# Patient Record
Sex: Male | Born: 1950 | Race: Black or African American | Hispanic: No | Marital: Single | Smoking: Former smoker
Health system: Southern US, Community
[De-identification: ages and names within clinical notes are randomized; demographics above are authoritative.]

## PROBLEM LIST (undated history)

## (undated) DIAGNOSIS — E785 Hyperlipidemia, unspecified: Secondary | ICD-10-CM

## (undated) DIAGNOSIS — I2510 Atherosclerotic heart disease of native coronary artery without angina pectoris: Secondary | ICD-10-CM

## (undated) DIAGNOSIS — I10 Essential (primary) hypertension: Secondary | ICD-10-CM

## (undated) DIAGNOSIS — M1990 Unspecified osteoarthritis, unspecified site: Secondary | ICD-10-CM

## (undated) DIAGNOSIS — J309 Allergic rhinitis, unspecified: Secondary | ICD-10-CM

## (undated) DIAGNOSIS — K649 Unspecified hemorrhoids: Secondary | ICD-10-CM

## (undated) DIAGNOSIS — K219 Gastro-esophageal reflux disease without esophagitis: Secondary | ICD-10-CM

## (undated) HISTORY — PX: OTHER SURGICAL HISTORY: SHX169

## (undated) HISTORY — PX: COLONOSCOPY: SHX5424

---

## 1997-08-22 ENCOUNTER — Ambulatory Visit (HOSPITAL_COMMUNITY): Admission: RE | Admit: 1997-08-22 | Discharge: 1997-08-22 | Payer: Self-pay | Admitting: *Deleted

## 1997-09-18 ENCOUNTER — Ambulatory Visit (HOSPITAL_COMMUNITY): Admission: RE | Admit: 1997-09-18 | Discharge: 1997-09-18 | Payer: Self-pay | Admitting: *Deleted

## 1998-01-17 ENCOUNTER — Encounter: Payer: Self-pay | Admitting: Family Medicine

## 1998-01-17 ENCOUNTER — Ambulatory Visit (HOSPITAL_COMMUNITY): Admission: RE | Admit: 1998-01-17 | Discharge: 1998-01-17 | Payer: Self-pay | Admitting: Family Medicine

## 2001-06-20 ENCOUNTER — Encounter: Payer: Self-pay | Admitting: General Surgery

## 2001-06-20 ENCOUNTER — Encounter: Payer: Self-pay | Admitting: Emergency Medicine

## 2001-06-20 ENCOUNTER — Inpatient Hospital Stay (HOSPITAL_COMMUNITY): Admission: EM | Admit: 2001-06-20 | Discharge: 2001-06-28 | Payer: Self-pay | Admitting: Emergency Medicine

## 2001-06-21 ENCOUNTER — Encounter: Payer: Self-pay | Admitting: Neurosurgery

## 2001-06-26 ENCOUNTER — Encounter: Payer: Self-pay | Admitting: General Surgery

## 2002-05-31 ENCOUNTER — Encounter: Payer: Self-pay | Admitting: Geriatric Medicine

## 2002-05-31 ENCOUNTER — Inpatient Hospital Stay (HOSPITAL_COMMUNITY): Admission: AD | Admit: 2002-05-31 | Discharge: 2002-06-02 | Payer: Self-pay | Admitting: Cardiology

## 2009-10-22 ENCOUNTER — Emergency Department (HOSPITAL_COMMUNITY): Admission: EM | Admit: 2009-10-22 | Discharge: 2009-10-22 | Payer: Self-pay | Admitting: Emergency Medicine
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
## 2010-07-17 NOTE — Cardiovascular Report (Signed)
NAME:  Sean Adams, Sean Adams                       ACCOUNT NO.:  [id number]   MEDICAL RECORD NO.:  [id number]                   PATIENT TYPE:  INP   LOCATION:  2005                                 FACILITY:  [facility]   PHYSICIAN:  Arturo Morton. Riley Kill, M.D. [facility]         DATE OF BIRTH:  1950/11/13   DATE OF PROCEDURE:  06/01/2002  DATE OF DISCHARGE:                              CARDIAC CATHETERIZATION   INDICATIONS FOR PROCEDURE:  The patient is a pleasant 60 year old gentleman  referred through the courtesy of Dr. Perrin Maltese.  The patient has had some recent  discomfort that has been worrisome for angina, and had mild lateral EKG  changes.  He was subsequently brought to the office and admitted for  unstable angina.  He was put on for the catheterization schedule after  discussion of the risks, benefits, and alternatives in detail.  Importantly,  we subsequently discovered that the patient had a catheterization in 1999 by  Dr. Meryl Crutch, and at that time, did not have critical disease.  The  patient was unaware of this prior catheterization, but the angiographic  studies match identically on the study from today.   PROCEDURES:  1. Left-heart catheterization.  2. Selective coronary arteriography.  3. Selective left ventriculography.   DESCRIPTION OF PROCEDURE:  The procedure was performed from the right  femoral artery using 6-French catheters.  He tolerated the procedure well.  There were no complications.  I then subsequently reviewed the patient's  films with his wife and his sister.  I also reviewed the films in detail  with the patient in the laboratory.  He was taken to the holding area in  satisfactory clinical condition.   HEMODYNAMIC DATA:  1. Central aortic pressure 125/75, mean 94.  2. Left ventricle 138/10/18.  3. No aortic left ventricular gradient on pullback across the aortic valve.   ANGIOGRAPHIC DATA:  1. Ventriculography was performed in the RAO projection.  Overall  systolic     function was well preserved, and no segmental abnormalities or     contractions were identified.  2. The right coronary artery appears identical to the previous angiographic     study.  It is widely patent, and a large-caliber vessel.  It provides a     posterior descending, posterolateral and a second posterolateral branch.     The first posterolateral branch at its ostium has some mild irregularity     that would measure about 30% in luminal reduction.  3. The left main coronary artery is free of critical disease.  4. The LAD courses to the apex and provides a small diagonal.  In the mid     portion of the vessel, there is some mild 20-30% irregularity that did     not appear to be flow limiting.  5. The circumflex has its takeoff which is free of critical disease.  It     then divides into an AV circumflex and a  marginal branch which then     divides proximally.  The marginal branch at its proximal bifurcation has     a superior sub-branch that has about 70% ostial narrowing.  This is     __________  out in multiple views.  The more inferior branch is a larger-     caliber vessel that is free of critical disease.  The AV circumflex has     about 50% mid narrowing.   CONCLUSIONS:  1. Preserved left ventricular function.  2. A 70% ostial stenosis of an obtuse marginal sub-branch as described in     the above text.  3. Other mild luminal irregularities as described above.   DISPOSITION:  The patient clearly has a circumflex lesion which is at least  moderate.  It is a non-ideal spot for percutaneous intervention.  Based upon  the findings, we are learning towards medical therapy at the present time.  He will be treated medically.  He will be started on a proton pump  inhibitor, aspirin, beta-blockade and possibly statin.  Followup with be  with myself and Dr. Perrin Maltese.                                               [name] D. Riley Kill, M.D. [facility]    TDS/MEDQ  D:  06/01/2002   T:  06/03/2002  Job:  [id number]   cc:   Jonita Albee, M.D.  [facility]  [address]  [place]  [place] [address]  Fax: [phone]   CV Laboratory

## 2010-07-17 NOTE — Discharge Summary (Signed)
NAME:  Sean Adams, Sean Adams                       ACCOUNT NO.:  [id number]   MEDICAL RECORD NO.:  [id number]                   PATIENT TYPE:  INP   LOCATION:  2005                                 FACILITY:  [facility]   PHYSICIAN:  Arturo Morton. Riley Kill, M.D. [facility]         DATE OF BIRTH:  [birth year]   DATE OF ADMISSION:  05/31/2002  DATE OF DISCHARGE:  06/02/2002                           DISCHARGE SUMMARY - REFERRING   HISTORY OF PRESENT ILLNESS:  The patient is a 60 year old black male who was  referred to the emergency room by his primary M.D. for evaluation of new  onset chest discomfort.  He describes a six-month history of upper chest  knot associated with shortness of breath. He denies any exertional symptoms,  but has noticed increased dyspnea on exertion. He had the chest discomfort  at approximately 10 p.m. while sitting on the edge of the bed. He described  this as a tightness-associated shortness of breath.  He denies any  relationship to swallowing or post prandial.  His history is notable for  hyperlipidemia and remote tobacco abuse.  He is not sure of his preadmission  medications.   LABORATORY DATA:  Serial three CK isoenzymes and troponins were negative for  myocardial infarction.  Fasting lipids showed a total cholesterol of 198,  triglycerides 180, HDL 41, LDL 121.  Admission H&H was 14.9 and 43.5, normal  indices, platelets 171, WBC 4.4.  Sodium 139, potassium 3.8, BUN 10,  creatinine 1.0, glucose 90, normal LFT's. Subsequent chemistries and  hematologies were unremarkable.   EKG showed normal sinus rhythm, left axis deviation, early repolarization.   HOSPITAL COURSE:  The patient was admitted to the unit 2000. Overnight he  did not have any further discomfort on IV heparin and nitroglycerin. He was  complaining of a headache. He ruled out for myocardial infarction.  It was  felt that with his risk factors, he should undergo cardiac catheterization.  This was performed on  June 01, 2002, by Dr. Riley Kill. According to Dr.  Rosalyn Charters progress note, he had a 20 to 30% proximal LAD, 20% proximal OM1,  50% distal circumflex, 30% proximal PLA.  Post sheath removal and bed rest,  the patient was ambulating without difficulty.  Catheterization site was  intact.  After review, Dr. Riley Kill felt that he could be discharged home.   DISCHARGE DIAGNOSIS:  1. Noncardiac chest discomfort.  2. Nonobstructive coronary artery disease.  3. Hyperlipidemia.  4. History as previously.   DISPOSITION:  The patient is discharged home.   DISCHARGE MEDICATIONS:  1. Lipitor 10 mg q.h.s.  2. Protonix 40 mg daily.  3. Lopressor 25 mg b.i.d.   He was asked to continue his aspirin 325 mg daily, sublingual nitroglycerin  as needed, Avalox 400 mg daily until finished, Bextra 10 mg daily,  Chlorazepate 30 mg q.h.s. as needed.   He was advised no lifting, driving, sexual activity, or heavy exertion for  two days. Maintain  a low salt, fat, and cholesterol diet.  If he has any  problems with his catheterization site, he was asked to call.   He was asked to call our office on Monday to arrange a three to four-week  appointment with Dr. Riley Kill and he will call on Monday to arrange a two-  week appointment with Dr. Perrin Maltese.  He will return to see Dr. Riley Kill in the  office. Arrangements should be made for fasting lipids and LFT panel since  Lipitor was initiated.  Also review of cardiac risk factor modifications.  When he sees Dr. Perrin Maltese in the office, consideration should be given to  gallbladder or GI evaluation.     Joellyn Rued, P.A. [facility]                    Thomas D. Riley Kill, M.D. [facility]    EW/MEDQ  D:  06/02/2002  T:  06/04/2002  Job:  [id number]   cc:   Jonita Albee, M.D.  [facility]  [address]  [place]  [place] [address]  Fax: [phone]   Arturo Morton. Riley Kill, M.D. [facility]

## 2017-01-10 ENCOUNTER — Ambulatory Visit (INDEPENDENT_AMBULATORY_CARE_PROVIDER_SITE_OTHER): Payer: Non-veteran care

## 2017-01-10 ENCOUNTER — Ambulatory Visit (INDEPENDENT_AMBULATORY_CARE_PROVIDER_SITE_OTHER): Payer: Non-veteran care | Admitting: Orthopaedic Surgery

## 2017-01-10 ENCOUNTER — Encounter (INDEPENDENT_AMBULATORY_CARE_PROVIDER_SITE_OTHER): Payer: Self-pay | Admitting: Orthopaedic Surgery

## 2017-01-10 DIAGNOSIS — M25561 Pain in right knee: Secondary | ICD-10-CM | POA: Diagnosis not present

## 2017-01-10 DIAGNOSIS — G8929 Other chronic pain: Secondary | ICD-10-CM

## 2017-01-10 DIAGNOSIS — M1711 Unilateral primary osteoarthritis, right knee: Secondary | ICD-10-CM | POA: Diagnosis not present

## 2017-01-10 MED ORDER — LIDOCAINE HCL 1 % IJ SOLN
2.0000 mL | INTRAMUSCULAR | Status: AC | PRN
  Administered 2017-01-10: 2 mL

## 2017-01-10 MED ORDER — BUPIVACAINE HCL 0.5 % IJ SOLN
2.0000 mL | INTRAMUSCULAR | Status: AC | PRN
  Administered 2017-01-10: 2 mL via INTRA_ARTICULAR

## 2017-01-10 MED ORDER — METHYLPREDNISOLONE ACETATE 40 MG/ML IJ SUSP
40.0000 mg | INTRAMUSCULAR | Status: AC | PRN
  Administered 2017-01-10: 40 mg via INTRA_ARTICULAR

## 2017-01-10 NOTE — Progress Notes (Signed)
   Office Visit Note   Patient: Sean Adams           Date of Birth: October 12, 1950           MRN: [id number] Visit Date: 01/10/2017              Requested by: Docia BarrierMikat, Ronald, PA-C [address] [place], [place] [id number] PCP: Patient, No Pcp Per   Assessment & Plan: Visit Diagnoses:  1. Chronic pain of right knee     Plan: Impression is end-stage right knee degenerative joint disease with a large effusion.  This was aspirated and injected with cortisone.  75 cc were obtained.  I gave him a handout on knee replacement surgery.  [facility]  Questions encouraged and answered.  Follow-Up Instructions: No Follow-up on file.   Orders:  Orders Placed This Encounter  Procedures  . XR KNEE 3 VIEW RIGHT   No orders of the defined types were placed in this encounter.     Procedures: Large Joint Inj: R knee on 01/10/2017 10:03 AM Indications: pain Details: 22 G needle  Arthrogram: No  Medications: 40 mg methylPREDNISolone acetate 40 MG/ML; 2 mL lidocaine 1 %; 2 mL bupivacaine 0.5 % Aspirate: 75 mL Outcome: tolerated well, no immediate complications Consent was given by the patient. Patient was prepped and draped in the usual sterile fashion.       Clinical Data: No additional findings.   Subjective: Chief Complaint  Patient presents with  . Right Knee - Pain    Patient 66 year old gentleman who comes in with right knee pain and swelling that has been going on for many years.  H that is limiting his ADLs and quality of life.  He has severe pain he denies any numbness and tingling.  He denies any injuries.  Does endorse giving way due to the pain.    Review of Systems  Constitutional: Negative.   All other systems reviewed and are negative.    Objective: Vital Signs: There were no vitals taken for this visit.  Physical Exam  Constitutional: He is oriented to person, place, and time. He appears well-developed and well-nourished.    HENT:  Head: Normocephalic and atraumatic.  Eyes: Pupils are equal, round, and reactive to light.  Neck: Neck supple.  Pulmonary/Chest: Effort normal.  Abdominal: Soft.  Musculoskeletal: Normal range of motion.  Neurological: He is alert and oriented to person, place, and time.  Skin: Skin is warm.  Psychiatric: He has a normal mood and affect. His behavior is normal. Judgment and thought content normal.  Nursing note and vitals reviewed.   Ortho Exam Right knee exam shows a large effusion.  Collaterals and cruciates are grossly intact. Specialty Comments:  No specialty comments available.  Imaging: Xr Knee 3 View Right  Result Date: 01/10/2017 Advanced degenerative joint disease    PMFS History: There are no active problems to display for this patient.  History reviewed. No pertinent past medical history.  History reviewed. No pertinent family history.  History reviewed. No pertinent surgical history. Social History   Occupational History  . Not on file  Tobacco Use  . Smoking status: Never Smoker  . Smokeless tobacco: Never Used  Substance and Sexual Activity  . Alcohol use: Not on file  . Drug use: Not on file  . Sexual activity: Not on file

## 2017-03-22 ENCOUNTER — Ambulatory Visit (INDEPENDENT_AMBULATORY_CARE_PROVIDER_SITE_OTHER): Payer: Medicare Other | Admitting: Orthopaedic Surgery

## 2017-03-28 ENCOUNTER — Ambulatory Visit (INDEPENDENT_AMBULATORY_CARE_PROVIDER_SITE_OTHER): Payer: PRIVATE HEALTH INSURANCE

## 2017-03-28 ENCOUNTER — Ambulatory Visit (INDEPENDENT_AMBULATORY_CARE_PROVIDER_SITE_OTHER): Payer: PRIVATE HEALTH INSURANCE | Admitting: Orthopaedic Surgery

## 2017-03-28 DIAGNOSIS — M1711 Unilateral primary osteoarthritis, right knee: Secondary | ICD-10-CM | POA: Diagnosis not present

## 2017-03-28 NOTE — Progress Notes (Signed)
   Office Visit Note   Patient: Sean Adams           Date of Birth: Mar 29, 1950           MRN: [id number] Visit Date: 03/28/2017              Requested by: No referring provider defined for this encounter. PCP: Patient, No Pcp Per   Assessment & Plan: Visit Diagnoses:  1. Primary osteoarthritis of right knee     Plan: Impression is advanced degenerative joint disease right knee.  [facility]  We discussed the risk benefits alternatives of surgery including incomplete relief of pain, DVT, stiffness, need for additional surgery.  He understands and wishes to proceed. Total face to face encounter time was greater than 25 minutes and over half of this time was spent in counseling and/or coordination of care.  Follow-Up Instructions: Return if symptoms worsen or fail to improve.   Orders:  Orders Placed This Encounter  Procedures  . XR KNEE 3 VIEW RIGHT   No orders of the defined types were placed in this encounter.     Procedures: No procedures performed   Clinical Data: No additional findings.   Subjective: No chief complaint on file.   Patient is following up today for his right knee degenerative joint disease.  He had partial relief from the previous injection back in November but he wishes to have a knee replacement because he is having significant difficulty with ADLs and chronic pain.  He recently had an aspiration but did not have cortisone injected.  He denies any history of DVTs.  He does have a history of hypertension.    Review of Systems  Constitutional: Negative.   All other systems reviewed and are negative.    Objective: Vital Signs: There were no vitals taken for this visit.  Physical Exam  Constitutional: He is oriented to person, place, and time. He appears well-developed and well-nourished.  HENT:  Head: Normocephalic and atraumatic.  Eyes: Pupils are equal, round, and reactive to  light.  Neck: Neck supple.  Pulmonary/Chest: Effort normal.  Abdominal: Soft.  Musculoskeletal: Normal range of motion.  Neurological: He is alert and oriented to person, place, and time.  Skin: Skin is warm.  Psychiatric: He has a normal mood and affect. His behavior is normal. Judgment and thought content normal.  Nursing note and vitals reviewed.   Ortho Exam Right knee exam shows a small joint effusion.  No skin changes. Specialty Comments:  No specialty comments available.  Imaging: Xr Knee 3 View Right  Result Date: 03/28/2017 Advanced tricompartmental DJD    PMFS History: Patient Active Problem List   Diagnosis Date Noted  . Primary osteoarthritis of right knee 01/10/2017   No past medical history on file.  No family history on file.  No past surgical history on file. Social History   Occupational History  . Not on file  Tobacco Use  . Smoking status: Never Smoker  . Smokeless tobacco: Never Used  Substance and Sexual Activity  . Alcohol use: Not on file  . Drug use: Not on file  . Sexual activity: Not on file

## 2017-04-26 NOTE — Pre-Procedure Instructions (Signed)
Sean BackersLawrence L Adams  04/26/2017      [id number] Sean Adams, Sean Adams - Sean [address] Sean Adams [place] [id number] Phone: [phone] Fax: [phone]    Your procedure is scheduled on Wed. March 6  [facility] Admitting at 1:20 P.M.  Call this number if you have problems the morning of surgery:  [phone]   Remember:  Do not eat food or drink liquids after midnight on Tues. March 5   Take these medicines the morning of surgery with A SIP OF WATER : claritin if needed,metoprolol tartrate (lopressor)              7 days prior to surgery STOP taking any Aspirin(unless otherwise instructed by your surgeon), Aleve, Naproxen, Ibuprofen, Motrin, Advil, Goody's, BC's, all herbal medications, fish oil, and all vitamins   Do not wear jewelry.  Do not wear lotions, powders, or perfumes, or deodorant.  Do not shave 48 hours prior to surgery.  Men may shave face and neck.  Do not bring valuables to the Adams.  [facility] is not responsible for any belongings or valuables.  Contacts, dentures or bridgework may not be worn into surgery.  Leave your suitcase in the car.  After surgery it may be brought to your room.  For patients admitted to the [facility], discharge time will be determined by your treatment team.  Patients discharged the day of surgery will not be allowed to drive home.   Special instructions:  Sean Adams- Preparing For Surgery  Before surgery, you can play an important role. Because skin is not sterile, your skin needs to be as free of germs as possible. You can reduce the number of germs on your skin by washing with CHG (chlorahexidine gluconate) Soap before surgery.  CHG is an antiseptic cleaner which kills germs and bonds with the skin to continue killing germs even after washing.  Please do not use if you have an allergy to CHG or antibacterial soaps. If your skin becomes reddened/irritated stop using the CHG.  Do not  shave (including legs and underarms) for at least 48 hours prior to first CHG shower. It is OK to shave your face.  Please follow these instructions carefully.   1. Shower the NIGHT BEFORE SURGERY and the MORNING OF SURGERY with CHG.   2. If you chose to wash your hair, wash your hair first as usual with your normal shampoo.  3. After you shampoo, rinse your hair and body thoroughly to remove the shampoo.  4. Use CHG as you would any other liquid soap. You can apply CHG directly to the skin and wash gently with a scrungie or a clean washcloth.   5. Apply the CHG Soap to your body ONLY FROM THE NECK DOWN.  Do not use on open wounds or open sores. Avoid contact with your eyes, ears, mouth and genitals (private parts). Wash Face and genitals (private parts)  with your normal soap.  6. Wash thoroughly, paying special attention to the area where your surgery will be performed.  7. Thoroughly rinse your body with warm water from the neck down.  8. DO NOT shower/wash with your normal soap after using and rinsing off the CHG Soap.  9. Pat yourself dry with a CLEAN TOWEL.  10. Wear CLEAN PAJAMAS to bed the night before surgery, wear comfortable clothes the morning of surgery  11. Place CLEAN SHEETS on your bed the night of your first shower and  DO NOT SLEEP WITH PETS.    Day of Surgery: Do not apply any deodorants/lotions. Please wear clean clothes to the Adams/surgery center.      Please read over the following fact sheets that you were given. Coughing and Deep Breathing, MRSA Information and Surgical Site Infection Prevention

## 2017-04-27 ENCOUNTER — Other Ambulatory Visit: Payer: Self-pay

## 2017-04-27 ENCOUNTER — Encounter (HOSPITAL_COMMUNITY): Payer: Self-pay

## 2017-04-27 ENCOUNTER — Encounter (HOSPITAL_COMMUNITY)
Admission: RE | Admit: 2017-04-27 | Discharge: 2017-04-27 | Disposition: A | Discharge status: Home / Self Care | Payer: Non-veteran care | Source: Ambulatory Visit | Attending: Orthopaedic Surgery | Admitting: Orthopaedic Surgery

## 2017-04-27 ENCOUNTER — Encounter (HOSPITAL_COMMUNITY)
Admission: RE | Admit: 2017-04-27 | Discharge: 2017-04-27 | Disposition: A | Discharge status: Home / Self Care | Payer: Non-veteran care | Source: Ambulatory Visit | Attending: Physician Assistant | Admitting: Physician Assistant

## 2017-04-27 DIAGNOSIS — I10 Essential (primary) hypertension: Secondary | ICD-10-CM | POA: Insufficient documentation

## 2017-04-27 DIAGNOSIS — Z01810 Encounter for preprocedural cardiovascular examination: Secondary | ICD-10-CM | POA: Insufficient documentation

## 2017-04-27 DIAGNOSIS — E785 Hyperlipidemia, unspecified: Secondary | ICD-10-CM | POA: Insufficient documentation

## 2017-04-27 DIAGNOSIS — I700 Atherosclerosis of aorta: Secondary | ICD-10-CM | POA: Insufficient documentation

## 2017-04-27 DIAGNOSIS — Z79899 Other long term (current) drug therapy: Secondary | ICD-10-CM | POA: Insufficient documentation

## 2017-04-27 DIAGNOSIS — Z01818 Encounter for other preprocedural examination: Secondary | ICD-10-CM | POA: Insufficient documentation

## 2017-04-27 DIAGNOSIS — M1711 Unilateral primary osteoarthritis, right knee: Secondary | ICD-10-CM | POA: Insufficient documentation

## 2017-04-27 DIAGNOSIS — Z01812 Encounter for preprocedural laboratory examination: Secondary | ICD-10-CM | POA: Diagnosis not present

## 2017-04-27 HISTORY — DX: Essential (primary) hypertension: I10

## 2017-04-27 HISTORY — DX: Unspecified osteoarthritis, unspecified site: M19.90

## 2017-04-27 HISTORY — DX: Atherosclerotic heart disease of native coronary artery without angina pectoris: I25.10

## 2017-04-27 HISTORY — DX: Hyperlipidemia, unspecified: E78.5

## 2017-04-27 HISTORY — DX: Unspecified hemorrhoids: K64.9

## 2017-04-27 HISTORY — DX: Allergic rhinitis, unspecified: J30.9

## 2017-04-27 HISTORY — DX: Gastro-esophageal reflux disease without esophagitis: K21.9

## 2017-04-27 LAB — CBC WITH DIFFERENTIAL/PLATELET
BASOS ABS: 0 10*3/uL (ref 0.0–0.1)
Basophils Relative: 1 %
EOS PCT: 1 %
Eosinophils Absolute: 0.1 10*3/uL (ref 0.0–0.7)
HEMATOCRIT: 46.2 % (ref 39.0–52.0)
Hemoglobin: 15.7 g/dL (ref 13.0–17.0)
LYMPHS ABS: 1.5 10*3/uL (ref 0.7–4.0)
LYMPHS PCT: 35 %
MCH: 29.8 pg (ref 26.0–34.0)
MCHC: 34 g/dL (ref 30.0–36.0)
MCV: 87.7 fL (ref 78.0–100.0)
MONO ABS: 0.3 10*3/uL (ref 0.1–1.0)
MONOS PCT: 8 %
NEUTROS ABS: 2.4 10*3/uL (ref 1.7–7.7)
Neutrophils Relative %: 55 %
PLATELETS: 197 10*3/uL (ref 150–400)
RBC: 5.27 MIL/uL (ref 4.22–5.81)
RDW: 14.4 % (ref 11.5–15.5)
WBC: 4.4 10*3/uL (ref 4.0–10.5)

## 2017-04-27 LAB — COMPREHENSIVE METABOLIC PANEL
ALT: 24 U/L (ref 17–63)
AST: 34 U/L (ref 15–41)
Albumin: 3.8 g/dL (ref 3.5–5.0)
Alkaline Phosphatase: 72 U/L (ref 38–126)
Anion gap: 9 (ref 5–15)
BILIRUBIN TOTAL: 0.7 mg/dL (ref 0.3–1.2)
BUN: 9 mg/dL (ref 6–20)
CHLORIDE: 105 mmol/L (ref 101–111)
CO2: 25 mmol/L (ref 22–32)
CREATININE: 0.93 mg/dL (ref 0.61–1.24)
Calcium: 9.3 mg/dL (ref 8.9–10.3)
GFR calc Af Amer: >60 mL/min (ref >60)
GFR calc non Af Amer: >60 mL/min (ref >60)
Glucose, Bld: 91 mg/dL (ref 65–99)
POTASSIUM: 4.1 mmol/L (ref 3.5–5.1)
Sodium: 139 mmol/L (ref 135–145)
TOTAL PROTEIN: 7.2 g/dL (ref 6.5–8.1)

## 2017-04-27 LAB — SURGICAL PCR SCREEN
MRSA, PCR: NEGATIVE
STAPHYLOCOCCUS AUREUS: NEGATIVE

## 2017-04-27 LAB — PROTIME-INR
INR: 1.04
Prothrombin Time: 13.6 seconds (ref 11.4–15.2)

## 2017-04-27 LAB — TYPE AND SCREEN
ABO/RH(D): O POS
ANTIBODY SCREEN: NEGATIVE

## 2017-04-27 LAB — ABO/RH: ABO/RH(D): O POS

## 2017-04-27 LAB — APTT: APTT: 27 s (ref 24–36)

## 2017-04-27 NOTE — Progress Notes (Addendum)
[facility] [place]  Denies seeing a cardiologist. Denies chest pain, cough, or fever. States he might have had a stress test many years ago. Heart cath noted 5-16- 2012, but pt doesn't remember this. Denies ever having an echo.
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
## 2017-04-29 ENCOUNTER — Telehealth (INDEPENDENT_AMBULATORY_CARE_PROVIDER_SITE_OTHER): Payer: Self-pay | Admitting: Orthopaedic Surgery

## 2017-04-29 ENCOUNTER — Encounter (HOSPITAL_COMMUNITY): Payer: Self-pay

## 2017-04-29 NOTE — Telephone Encounter (Signed)
[facility]  [phone]  Please call to coordinate care

## 2017-05-02 NOTE — Progress Notes (Signed)
Ok, thanks for the info

## 2017-05-03 MED ORDER — SODIUM CHLORIDE 0.9 % IV SOLN
2000.0000 mg | INTRAVENOUS | Status: AC
  Administered 2017-05-04: 2000 mg via TOPICAL
  Filled 2017-05-03: qty 20

## 2017-05-03 MED ORDER — CEFAZOLIN SODIUM-DEXTROSE 2-4 GM/100ML-% IV SOLN
2.0000 g | INTRAVENOUS | Status: AC
  Administered 2017-05-04: 2 g via INTRAVENOUS
  Filled 2017-05-03: qty 100

## 2017-05-03 MED ORDER — TRANEXAMIC ACID 1000 MG/10ML IV SOLN
1000.0000 mg | INTRAVENOUS | Status: AC
  Administered 2017-05-04: 1000 mg via INTRAVENOUS
  Filled 2017-05-03: qty 1100

## 2017-05-03 MED ORDER — LACTATED RINGERS IV SOLN
INTRAVENOUS | Status: DC
  Administered 2017-05-04 (×2): via INTRAVENOUS

## 2017-05-04 ENCOUNTER — Inpatient Hospital Stay (HOSPITAL_COMMUNITY): Payer: Non-veteran care | Admitting: Emergency Medicine

## 2017-05-04 ENCOUNTER — Encounter (HOSPITAL_COMMUNITY): Payer: Self-pay | Admitting: *Deleted

## 2017-05-04 ENCOUNTER — Other Ambulatory Visit (INDEPENDENT_AMBULATORY_CARE_PROVIDER_SITE_OTHER): Payer: Self-pay

## 2017-05-04 ENCOUNTER — Inpatient Hospital Stay (HOSPITAL_COMMUNITY)
Admission: RE | Admit: 2017-05-04 | Discharge: 2017-05-05 | DRG: 470 | Disposition: A | Discharge status: Home Health | Payer: Non-veteran care | Source: Ambulatory Visit | Attending: Orthopaedic Surgery | Admitting: Orthopaedic Surgery

## 2017-05-04 ENCOUNTER — Encounter (HOSPITAL_COMMUNITY)
Admission: RE | Disposition: A | Discharge status: Home Health | Payer: Self-pay | Source: Ambulatory Visit | Attending: Orthopaedic Surgery

## 2017-05-04 ENCOUNTER — Inpatient Hospital Stay (HOSPITAL_COMMUNITY): Payer: Non-veteran care

## 2017-05-04 ENCOUNTER — Inpatient Hospital Stay (HOSPITAL_COMMUNITY): Payer: Non-veteran care | Admitting: Anesthesiology

## 2017-05-04 ENCOUNTER — Telehealth (INDEPENDENT_AMBULATORY_CARE_PROVIDER_SITE_OTHER): Payer: Self-pay

## 2017-05-04 DIAGNOSIS — Z79899 Other long term (current) drug therapy: Secondary | ICD-10-CM

## 2017-05-04 DIAGNOSIS — Z8249 Family history of ischemic heart disease and other diseases of the circulatory system: Secondary | ICD-10-CM | POA: Diagnosis not present

## 2017-05-04 DIAGNOSIS — E785 Hyperlipidemia, unspecified: Secondary | ICD-10-CM | POA: Diagnosis present

## 2017-05-04 DIAGNOSIS — I2510 Atherosclerotic heart disease of native coronary artery without angina pectoris: Secondary | ICD-10-CM | POA: Diagnosis present

## 2017-05-04 DIAGNOSIS — M1711 Unilateral primary osteoarthritis, right knee: Secondary | ICD-10-CM | POA: Diagnosis present

## 2017-05-04 DIAGNOSIS — D62 Acute posthemorrhagic anemia: Secondary | ICD-10-CM | POA: Diagnosis not present

## 2017-05-04 DIAGNOSIS — I10 Essential (primary) hypertension: Secondary | ICD-10-CM | POA: Diagnosis present

## 2017-05-04 DIAGNOSIS — K219 Gastro-esophageal reflux disease without esophagitis: Secondary | ICD-10-CM | POA: Diagnosis present

## 2017-05-04 DIAGNOSIS — M25761 Osteophyte, right knee: Secondary | ICD-10-CM | POA: Diagnosis present

## 2017-05-04 DIAGNOSIS — Z96659 Presence of unspecified artificial knee joint: Secondary | ICD-10-CM

## 2017-05-04 HISTORY — PX: TOTAL KNEE ARTHROPLASTY: SHX125

## 2017-05-04 SURGERY — ARTHROPLASTY, KNEE, TOTAL
Anesthesia: Spinal | Laterality: Right

## 2017-05-04 MED ORDER — DIPHENHYDRAMINE HCL 12.5 MG/5ML PO ELIX
25.0000 mg | ORAL_SOLUTION | ORAL | Status: DC | PRN
  Administered 2017-05-04 – 2017-05-05 (×2): 25 mg via ORAL
  Filled 2017-05-04 (×2): qty 10

## 2017-05-04 MED ORDER — SORBITOL 70 % SOLN: 30.0000 mL | Freq: Every day | Status: DC | PRN

## 2017-05-04 MED ORDER — ASPIRIN EC 325 MG PO TBEC
325.0000 mg | DELAYED_RELEASE_TABLET | Freq: Two times a day (BID) | ORAL | Status: DC
  Administered 2017-05-05 (×2): 325 mg via ORAL
  Filled 2017-05-04 (×2): qty 1

## 2017-05-04 MED ORDER — BUPIVACAINE LIPOSOME 1.3 % IJ SUSP
20.0000 mL | INTRAMUSCULAR | Status: DC
  Filled 2017-05-04: qty 20

## 2017-05-04 MED ORDER — SENNOSIDES-DOCUSATE SODIUM 8.6-50 MG PO TABS: 1.0000 | ORAL_TABLET | Freq: Every evening | ORAL | 1 refills | Status: DC | PRN

## 2017-05-04 MED ORDER — TIZANIDINE HCL 4 MG PO TABS: 4.0000 mg | ORAL_TABLET | Freq: Four times a day (QID) | ORAL | 2 refills | Status: DC | PRN

## 2017-05-04 MED ORDER — OXYCODONE HCL ER 10 MG PO T12A
10.0000 mg | EXTENDED_RELEASE_TABLET | Freq: Two times a day (BID) | ORAL | Status: DC
  Administered 2017-05-04 – 2017-05-05 (×2): 10 mg via ORAL
  Filled 2017-05-04 (×2): qty 1

## 2017-05-04 MED ORDER — KETOROLAC TROMETHAMINE 15 MG/ML IJ SOLN
15.0000 mg | Freq: Four times a day (QID) | INTRAMUSCULAR | Status: AC
  Administered 2017-05-04 – 2017-05-05 (×4): 15 mg via INTRAVENOUS
  Filled 2017-05-04 (×4): qty 1

## 2017-05-04 MED ORDER — 0.9 % SODIUM CHLORIDE (POUR BTL) OPTIME
TOPICAL | Status: DC | PRN
  Administered 2017-05-04: 1000 mL

## 2017-05-04 MED ORDER — BUPIVACAINE LIPOSOME 1.3 % IJ SUSP
INTRAMUSCULAR | Status: DC | PRN
  Administered 2017-05-04: 20 mL

## 2017-05-04 MED ORDER — PROPOFOL 1000 MG/100ML IV EMUL
INTRAVENOUS | Status: AC
Start: 2017-05-04 — End: 2017-05-04
  Filled 2017-05-04: qty 100

## 2017-05-04 MED ORDER — OXYCODONE HCL ER 10 MG PO T12A: 10.0000 mg | EXTENDED_RELEASE_TABLET | Freq: Two times a day (BID) | ORAL | 0 refills | Status: DC

## 2017-05-04 MED ORDER — MIDAZOLAM HCL 2 MG/2ML IJ SOLN
INTRAMUSCULAR | Status: AC
  Administered 2017-05-04: 1 mg
  Filled 2017-05-04: qty 2

## 2017-05-04 MED ORDER — FENTANYL CITRATE (PF) 100 MCG/2ML IJ SOLN: 25.0000 ug | INTRAMUSCULAR | Status: DC | PRN

## 2017-05-04 MED ORDER — SODIUM CHLORIDE 0.9% FLUSH
INTRAVENOUS | Status: DC | PRN
  Administered 2017-05-04: 40 mL

## 2017-05-04 MED ORDER — SULFAMETHOXAZOLE-TRIMETHOPRIM 800-160 MG PO TABS: 1.0000 | ORAL_TABLET | Freq: Two times a day (BID) | ORAL | 0 refills | Status: DC

## 2017-05-04 MED ORDER — VANCOMYCIN HCL POWD
Status: DC | PRN
  Administered 2017-05-04: 1000 mg via TOPICAL

## 2017-05-04 MED ORDER — ONDANSETRON HCL 4 MG/2ML IJ SOLN
INTRAMUSCULAR | Status: AC
  Filled 2017-05-04: qty 2

## 2017-05-04 MED ORDER — SULFAMETHOXAZOLE-TRIMETHOPRIM 800-160 MG PO TABS
1.0000 | ORAL_TABLET | Freq: Two times a day (BID) | ORAL | Status: DC
  Administered 2017-05-04 – 2017-05-05 (×2): 1 via ORAL
  Filled 2017-05-04 (×2): qty 1

## 2017-05-04 MED ORDER — LIDOCAINE 2% (20 MG/ML) 5 ML SYRINGE
INTRAMUSCULAR | Status: DC | PRN
  Administered 2017-05-04: 40 mL via INTRAVENOUS

## 2017-05-04 MED ORDER — PHENOL 1.4 % MT LIQD: 1.0000 | OROMUCOSAL | Status: DC | PRN

## 2017-05-04 MED ORDER — ACETAMINOPHEN 325 MG PO TABS
325.0000 mg | ORAL_TABLET | Freq: Four times a day (QID) | ORAL | Status: DC | PRN
  Administered 2017-05-05: 650 mg via ORAL
  Filled 2017-05-04: qty 2

## 2017-05-04 MED ORDER — METOPROLOL TARTRATE 12.5 MG HALF TABLET
12.5000 mg | ORAL_TABLET | Freq: Two times a day (BID) | ORAL | Status: DC
  Administered 2017-05-04 – 2017-05-05 (×2): 12.5 mg via ORAL
  Filled 2017-05-04 (×2): qty 1

## 2017-05-04 MED ORDER — OXYCODONE HCL 5 MG PO TABS: 5.0000 mg | ORAL_TABLET | ORAL | 0 refills | Status: AC | PRN

## 2017-05-04 MED ORDER — SODIUM CHLORIDE 0.9 % IR SOLN
Status: DC | PRN
  Administered 2017-05-04: 3000 mL

## 2017-05-04 MED ORDER — METHOCARBAMOL 1000 MG/10ML IJ SOLN
500.0000 mg | Freq: Four times a day (QID) | INTRAVENOUS | Status: DC | PRN
  Filled 2017-05-04: qty 5

## 2017-05-04 MED ORDER — OXYCODONE HCL 5 MG/5ML PO SOLN: 5.0000 mg | Freq: Once | ORAL | Status: DC | PRN

## 2017-05-04 MED ORDER — METHOCARBAMOL 500 MG PO TABS
500.0000 mg | ORAL_TABLET | Freq: Four times a day (QID) | ORAL | Status: DC | PRN
  Administered 2017-05-05 (×2): 500 mg via ORAL
  Filled 2017-05-04 (×2): qty 1

## 2017-05-04 MED ORDER — FENTANYL CITRATE (PF) 250 MCG/5ML IJ SOLN
INTRAMUSCULAR | Status: AC
  Filled 2017-05-04: qty 5

## 2017-05-04 MED ORDER — PHENYLEPHRINE 40 MCG/ML (10ML) SYRINGE FOR IV PUSH (FOR BLOOD PRESSURE SUPPORT)
PREFILLED_SYRINGE | INTRAVENOUS | Status: DC | PRN
  Administered 2017-05-04: 80 ug via INTRAVENOUS

## 2017-05-04 MED ORDER — MAGNESIUM CITRATE PO SOLN: 1.0000 | Freq: Once | ORAL | Status: DC | PRN

## 2017-05-04 MED ORDER — ASPIRIN EC 325 MG PO TBEC: 325.0000 mg | DELAYED_RELEASE_TABLET | Freq: Two times a day (BID) | ORAL | 0 refills | Status: DC

## 2017-05-04 MED ORDER — ALUM & MAG HYDROXIDE-SIMETH 200-200-20 MG/5ML PO SUSP
30.0000 mL | ORAL | Status: DC | PRN
  Administered 2017-05-05: 30 mL via ORAL
  Filled 2017-05-04: qty 30

## 2017-05-04 MED ORDER — ONDANSETRON HCL 4 MG PO TABS: 4.0000 mg | ORAL_TABLET | Freq: Three times a day (TID) | ORAL | 0 refills | Status: DC | PRN

## 2017-05-04 MED ORDER — POLYETHYLENE GLYCOL 3350 17 G PO PACK: 17.0000 g | PACK | Freq: Every day | ORAL | Status: DC | PRN

## 2017-05-04 MED ORDER — METOCLOPRAMIDE HCL 5 MG PO TABS: 5.0000 mg | ORAL_TABLET | Freq: Three times a day (TID) | ORAL | Status: DC | PRN

## 2017-05-04 MED ORDER — FENTANYL CITRATE (PF) 250 MCG/5ML IJ SOLN
INTRAMUSCULAR | Status: DC | PRN
  Administered 2017-05-04: 50 ug via INTRAVENOUS

## 2017-05-04 MED ORDER — TRANEXAMIC ACID 1000 MG/10ML IV SOLN
1000.0000 mg | Freq: Once | INTRAVENOUS | Status: AC
  Administered 2017-05-04: 1000 mg via INTRAVENOUS
  Filled 2017-05-04: qty 10

## 2017-05-04 MED ORDER — HYDRALAZINE HCL 20 MG/ML IJ SOLN
INTRAMUSCULAR | Status: AC
  Administered 2017-05-04: 10 mg via INTRAVENOUS
  Filled 2017-05-04: qty 1

## 2017-05-04 MED ORDER — FENTANYL CITRATE (PF) 100 MCG/2ML IJ SOLN
INTRAMUSCULAR | Status: AC
  Administered 2017-05-04: 50 ug
  Filled 2017-05-04: qty 2

## 2017-05-04 MED ORDER — DEXAMETHASONE SODIUM PHOSPHATE 10 MG/ML IJ SOLN
INTRAMUSCULAR | Status: DC | PRN
  Administered 2017-05-04: 10 mg via INTRAVENOUS

## 2017-05-04 MED ORDER — PROPOFOL 10 MG/ML IV BOLUS
INTRAVENOUS | Status: AC
  Filled 2017-05-04: qty 20

## 2017-05-04 MED ORDER — DEXTROSE 5 % IV SOLN
3.0000 g | Freq: Four times a day (QID) | INTRAVENOUS | Status: AC
  Administered 2017-05-04 – 2017-05-05 (×3): 3 g via INTRAVENOUS
  Filled 2017-05-04 (×3): qty 3000

## 2017-05-04 MED ORDER — DEXAMETHASONE SODIUM PHOSPHATE 10 MG/ML IJ SOLN
10.0000 mg | Freq: Once | INTRAMUSCULAR | Status: AC
  Administered 2017-05-05: 10 mg via INTRAVENOUS
  Filled 2017-05-04: qty 1

## 2017-05-04 MED ORDER — PHENYLEPHRINE 40 MCG/ML (10ML) SYRINGE FOR IV PUSH (FOR BLOOD PRESSURE SUPPORT)
PREFILLED_SYRINGE | INTRAVENOUS | Status: AC
  Filled 2017-05-04: qty 10

## 2017-05-04 MED ORDER — SODIUM CHLORIDE 0.9 % IV SOLN
INTRAVENOUS | Status: DC
  Administered 2017-05-04: 21:00:00 via INTRAVENOUS

## 2017-05-04 MED ORDER — PROMETHAZINE HCL 25 MG PO TABS: 25.0000 mg | ORAL_TABLET | Freq: Four times a day (QID) | ORAL | 1 refills | Status: DC | PRN

## 2017-05-04 MED ORDER — OXYCODONE HCL 5 MG PO TABS: 5.0000 mg | ORAL_TABLET | Freq: Once | ORAL | Status: DC | PRN

## 2017-05-04 MED ORDER — MENTHOL 3 MG MT LOZG: 1.0000 | LOZENGE | OROMUCOSAL | Status: DC | PRN

## 2017-05-04 MED ORDER — HYDROMORPHONE HCL 1 MG/ML IJ SOLN
0.5000 mg | INTRAMUSCULAR | Status: DC | PRN
  Administered 2017-05-04 – 2017-05-05 (×2): 1 mg via INTRAVENOUS
  Filled 2017-05-04 (×2): qty 1

## 2017-05-04 MED ORDER — DOCUSATE SODIUM 100 MG PO CAPS
100.0000 mg | ORAL_CAPSULE | Freq: Two times a day (BID) | ORAL | Status: DC
  Administered 2017-05-04 – 2017-05-05 (×2): 100 mg via ORAL
  Filled 2017-05-04 (×2): qty 1

## 2017-05-04 MED ORDER — ONDANSETRON HCL 4 MG PO TABS: 4.0000 mg | ORAL_TABLET | Freq: Four times a day (QID) | ORAL | Status: DC | PRN

## 2017-05-04 MED ORDER — CHLORHEXIDINE GLUCONATE 4 % EX LIQD: 60.0000 mL | Freq: Once | CUTANEOUS | Status: DC

## 2017-05-04 MED ORDER — PHENYLEPHRINE HCL 10 MG/ML IJ SOLN
INTRAVENOUS | Status: DC | PRN
  Administered 2017-05-04: 25 ug/min via INTRAVENOUS

## 2017-05-04 MED ORDER — HYDRALAZINE HCL 20 MG/ML IJ SOLN: 20.0000 mg | Freq: Four times a day (QID) | INTRAMUSCULAR | Status: DC | PRN

## 2017-05-04 MED ORDER — OXYCODONE HCL 5 MG PO TABS
10.0000 mg | ORAL_TABLET | ORAL | Status: DC | PRN
  Administered 2017-05-05: 15 mg via ORAL
  Filled 2017-05-04 (×3): qty 3

## 2017-05-04 MED ORDER — LISINOPRIL 40 MG PO TABS
40.0000 mg | ORAL_TABLET | Freq: Every day | ORAL | Status: DC
  Administered 2017-05-04 – 2017-05-05 (×2): 40 mg via ORAL
  Filled 2017-05-04 (×2): qty 1

## 2017-05-04 MED ORDER — VANCOMYCIN HCL 1000 MG IV SOLR
INTRAVENOUS | Status: AC
  Filled 2017-05-04: qty 1000

## 2017-05-04 MED ORDER — PROPOFOL 500 MG/50ML IV EMUL
INTRAVENOUS | Status: DC | PRN
  Administered 2017-05-04: 50 ug/kg/min via INTRAVENOUS

## 2017-05-04 MED ORDER — ARTIFICIAL TEARS OPHTHALMIC OINT
TOPICAL_OINTMENT | OPHTHALMIC | Status: AC
  Filled 2017-05-04: qty 3.5

## 2017-05-04 MED ORDER — ONDANSETRON HCL 4 MG/2ML IJ SOLN
INTRAMUSCULAR | Status: DC | PRN
  Administered 2017-05-04: 4 mg via INTRAVENOUS

## 2017-05-04 MED ORDER — BUPIVACAINE-EPINEPHRINE (PF) 0.5% -1:200000 IJ SOLN
INTRAMUSCULAR | Status: DC | PRN
Start: 2017-05-04 — End: 2017-05-04
  Administered 2017-05-04: 30 mL via PERINEURAL

## 2017-05-04 MED ORDER — DEXAMETHASONE SODIUM PHOSPHATE 10 MG/ML IJ SOLN
INTRAMUSCULAR | Status: AC
  Filled 2017-05-04: qty 1

## 2017-05-04 MED ORDER — METOCLOPRAMIDE HCL 5 MG/ML IJ SOLN: 5.0000 mg | Freq: Three times a day (TID) | INTRAMUSCULAR | Status: DC | PRN

## 2017-05-04 MED ORDER — ONDANSETRON HCL 4 MG/2ML IJ SOLN: 4.0000 mg | Freq: Once | INTRAMUSCULAR | Status: DC | PRN

## 2017-05-04 MED ORDER — HYDRALAZINE HCL 20 MG/ML IJ SOLN
10.0000 mg | Freq: Once | INTRAMUSCULAR | Status: AC
  Administered 2017-05-04: 10 mg via INTRAVENOUS

## 2017-05-04 MED ORDER — MIDAZOLAM HCL 2 MG/2ML IJ SOLN
INTRAMUSCULAR | Status: AC
  Filled 2017-05-04: qty 2

## 2017-05-04 MED ORDER — OXYCODONE HCL 5 MG PO TABS
5.0000 mg | ORAL_TABLET | ORAL | Status: DC | PRN
  Administered 2017-05-05: 10 mg via ORAL
  Filled 2017-05-04: qty 2

## 2017-05-04 MED ORDER — ONDANSETRON HCL 4 MG/2ML IJ SOLN: 4.0000 mg | Freq: Four times a day (QID) | INTRAMUSCULAR | Status: DC | PRN

## 2017-05-04 MED ORDER — PROPOFOL 10 MG/ML IV BOLUS
INTRAVENOUS | Status: DC | PRN
  Administered 2017-05-04 (×3): 20 mg via INTRAVENOUS

## 2017-05-04 MED ORDER — MAGNESIUM MALATE 1250 (141.7 MG) MG PO TABS: ORAL_TABLET | Freq: Every day | ORAL | Status: DC

## 2017-05-04 MED ORDER — BUPIVACAINE IN DEXTROSE 0.75-8.25 % IT SOLN
INTRATHECAL | Status: DC | PRN
  Administered 2017-05-04: 1.8 mL via INTRATHECAL

## 2017-05-04 SURGICAL SUPPLY — 72 items
ALCOHOL ISOPROPYL (RUBBING) (MISCELLANEOUS) ×2 IMPLANT
BAG DECANTER FOR FLEXI CONT (MISCELLANEOUS) ×2 IMPLANT
BANDAGE ESMARK 6X9 LF (GAUZE/BANDAGES/DRESSINGS) ×1 IMPLANT
BASEPLATE TIBIAL #8 CEMENTED (Knees) ×2 IMPLANT
BENZOIN TINCTURE PRP APPL 2/3 (GAUZE/BANDAGES/DRESSINGS) ×2 IMPLANT
BLADE SAW SGTL 13.0X1.19X90.0M (BLADE) ×2 IMPLANT
BNDG ELASTIC 6X10 VLCR STRL LF (GAUZE/BANDAGES/DRESSINGS) ×2 IMPLANT
BNDG ESMARK 6X9 LF (GAUZE/BANDAGES/DRESSINGS) ×2
BONE CEMENT PALACOS R-G (Cement) ×4 IMPLANT
BOWL SMART MIX CTS (DISPOSABLE) ×2 IMPLANT
CEMENT BONE PALACOS R-G (Cement) ×2 IMPLANT
CLOSURE STERI-STRIP 1/4X4 (GAUZE/BANDAGES/DRESSINGS) ×2 IMPLANT
CLSR STERI-STRIP ANTIMIC 1/2X4 (GAUZE/BANDAGES/DRESSINGS) ×4 IMPLANT
COVER SURGICAL LIGHT HANDLE (MISCELLANEOUS) ×2 IMPLANT
CUFF TOURNIQUET SINGLE 34IN LL (TOURNIQUET CUFF) ×2 IMPLANT
CUFF TOURNIQUET SINGLE 44IN (TOURNIQUET CUFF) IMPLANT
DRAPE EXTREMITY T 121X128X90 (DRAPE) ×2 IMPLANT
DRAPE HALF SHEET 40X57 (DRAPES) ×2 IMPLANT
DRAPE INCISE IOBAN 66X45 STRL (DRAPES) IMPLANT
DRAPE ORTHO SPLIT 77X108 STRL (DRAPES) ×2
DRAPE POUCH INSTRU U-SHP 10X18 (DRAPES) ×2 IMPLANT
DRAPE SURG 17X11 SM STRL (DRAPES) ×4 IMPLANT
DRAPE SURG ORHT 6 SPLT 77X108 (DRAPES) ×2 IMPLANT
DRSG AQUACEL AG ADV 3.5X 6 (GAUZE/BANDAGES/DRESSINGS) ×2 IMPLANT
DRSG AQUACEL AG ADV 3.5X14 (GAUZE/BANDAGES/DRESSINGS) ×2 IMPLANT
DURAPREP 26ML APPLICATOR (WOUND CARE) ×4 IMPLANT
ELECT CAUTERY BLADE 6.4 (BLADE) ×2 IMPLANT
ELECT REM PT RETURN 9FT ADLT (ELECTROSURGICAL) ×2
ELECTRODE REM PT RTRN 9FT ADLT (ELECTROSURGICAL) ×1 IMPLANT
FEMORAL PEG DISTAL FIXATION (Orthopedic Implant) ×2 IMPLANT
GLOVE BIOGEL PI IND STRL 7.0 (GLOVE) ×1 IMPLANT
GLOVE BIOGEL PI INDICATOR 7.0 (GLOVE) ×1
GLOVE ECLIPSE 7.0 STRL STRAW (GLOVE) ×2 IMPLANT
GLOVE SKINSENSE NS SZ7.5 (GLOVE) ×1
GLOVE SKINSENSE STRL SZ7.5 (GLOVE) ×1 IMPLANT
GLOVE SURG SYN 7.5  E (GLOVE) ×4
GLOVE SURG SYN 7.5 E (GLOVE) ×4 IMPLANT
GOWN STRL REIN XL XLG (GOWN DISPOSABLE) ×2 IMPLANT
GOWN STRL REUS W/ TWL LRG LVL3 (GOWN DISPOSABLE) ×1 IMPLANT
GOWN STRL REUS W/TWL LRG LVL3 (GOWN DISPOSABLE) ×1
HANDPIECE INTERPULSE COAX TIP (DISPOSABLE) ×1
HOOD PEEL AWAY FLYTE STAYCOOL (MISCELLANEOUS) ×4 IMPLANT
IMMOBILIZER KNEE 22 (SOFTGOODS) ×2 IMPLANT
INSERT TIBIAL 9MM #8 (Knees) ×2 IMPLANT
KIT BASIN OR (CUSTOM PROCEDURE TRAY) ×2 IMPLANT
KIT ROOM TURNOVER OR (KITS) ×2 IMPLANT
MANIFOLD NEPTUNE II (INSTRUMENTS) ×2 IMPLANT
MARKER SKIN DUAL TIP RULER LAB (MISCELLANEOUS) ×2 IMPLANT
NEEDLE SPNL 18GX3.5 QUINCKE PK (NEEDLE) ×2 IMPLANT
NS IRRIG 1000ML POUR BTL (IV SOLUTION) ×2 IMPLANT
PACK TOTAL JOINT (CUSTOM PROCEDURE TRAY) ×2 IMPLANT
PAD ARMBOARD 7.5X6 YLW CONV (MISCELLANEOUS) ×4 IMPLANT
PATELLA ASYMMETRIC 38X11 (Knees) ×2 IMPLANT
POST FEMORAL RT SZ 7 (Femur) ×2 IMPLANT
SAW OSC TIP CART 19.5X105X1.3 (SAW) ×2 IMPLANT
SET HNDPC FAN SPRY TIP SCT (DISPOSABLE) ×1 IMPLANT
STAPLER VISISTAT 35W (STAPLE) IMPLANT
SUCTION FRAZIER HANDLE 10FR (MISCELLANEOUS) ×1
SUCTION TUBE FRAZIER 10FR DISP (MISCELLANEOUS) ×1 IMPLANT
SUT ETHILON 2 0 FS 18 (SUTURE) IMPLANT
SUT MNCRL AB 4-0 PS2 18 (SUTURE) IMPLANT
SUT VIC AB 0 CT1 27 (SUTURE) ×2
SUT VIC AB 0 CT1 27XBRD ANBCTR (SUTURE) ×2 IMPLANT
SUT VIC AB 1 CTX 27 (SUTURE) ×6 IMPLANT
SUT VIC AB 2-0 CT1 27 (SUTURE) ×3
SUT VIC AB 2-0 CT1 TAPERPNT 27 (SUTURE) ×3 IMPLANT
SYR 50ML LL SCALE MARK (SYRINGE) ×2 IMPLANT
TOWEL OR 17X24 6PK STRL BLUE (TOWEL DISPOSABLE) ×2 IMPLANT
TOWEL OR 17X26 10 PK STRL BLUE (TOWEL DISPOSABLE) ×2 IMPLANT
TRAY CATH 16FR W/PLASTIC CATH (SET/KITS/TRAYS/PACK) IMPLANT
UNDERPAD 30X30 (UNDERPADS AND DIAPERS) ×2 IMPLANT
WRAP KNEE MAXI GEL POST OP (GAUZE/BANDAGES/DRESSINGS) ×2 IMPLANT
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
## 2017-05-04 NOTE — Anesthesia Procedure Notes (Signed)
Spinal  Patient location during procedure: OR Start time: 05/04/2017 3:11 PM End time: 05/04/2017 3:15 PM Staffing Anesthesiologist: Beryle LatheBrock, Thomas E, MD Performed: anesthesiologist  Preanesthetic Checklist Completed: patient identified, surgical consent, pre-op evaluation, timeout performed, IV checked, risks and benefits discussed and monitors and equipment checked Spinal Block Patient position: sitting Prep: DuraPrep Patient monitoring: heart rate, cardiac monitor, continuous pulse ox and blood pressure Approach: midline Location: L2-3 Injection technique: single-shot Needle Needle type: Pencan  Needle gauge: 24 G Additional Notes Functioning IV was confirmed and monitors were applied. Sterile prep and drape, including hand hygiene, mask, and sterile gloves were used. The patient was positioned and the spine was prepped. The skin was anesthetized with lidocaine. Free flow of clear CSF was obtained prior to injecting local anesthetic into the CSF. The spinal needle aspirated freely following injection. The needle was carefully withdrawn. The patient tolerated the procedure well. Consent was obtained prior to the procedure with all questions answered and concerns addressed. Risks including, but not limited to, bleeding, infection, nerve damage, paralysis, failed block, inadequate analgesia, allergic reaction, high spinal, itching, and headache were discussed and the patient wished to proceed.  Leslye Peerhomas Brock, MD

## 2017-05-04 NOTE — Transfer of Care (Signed)
Immediate Anesthesia Transfer of Care Note  Patient: Sean Adams  Procedure(s) Performed: RIGHT TOTAL KNEE ARTHROPLASTY (Right )  Patient Location: PACU  Anesthesia Type:Spinal  Level of Consciousness: lethargic and responds to stimulation  Airway & Oxygen Therapy: Patient Spontanous Breathing and Patient connected to face mask oxygen  Post-op Assessment: Report given to RN  Post vital signs: Reviewed and stable  Last Vitals:  Vitals:   05/04/17 1339  BP: (!) 188/90  Pulse: (!) 56  Resp: 20  Temp: (!) 36.4 C  SpO2: 100%    Last Pain:  Vitals:   05/04/17 1347  TempSrc:   PainSc: 6          Complications: No apparent anesthesia complications

## 2017-05-04 NOTE — Discharge Instructions (Signed)

## 2017-05-04 NOTE — Anesthesia Procedure Notes (Signed)
Procedure Name: MAC Date/Time: 05/04/2017 3:10 PM Performed by: Barrington Ellison, CRNA Pre-anesthesia Checklist: Patient identified, Emergency Drugs available, Suction available, Patient being monitored and Timeout performed Patient Re-evaluated:Patient Re-evaluated prior to induction Oxygen Delivery Method: Simple face mask

## 2017-05-04 NOTE — Anesthesia Procedure Notes (Signed)
Anesthesia Regional Block: Adductor canal block   Pre-Anesthetic Checklist: ,, timeout performed, Correct Patient, Correct Site, Correct Laterality, Correct Procedure, Correct Position, site marked, Risks and benefits discussed,  Surgical consent,  Pre-op evaluation,  At surgeon's request and post-op pain management  Laterality: Right  Prep: chloraprep       Needles:  Injection technique: Single-shot  Needle Type: Echogenic Needle     Needle Length: 10cm  Needle Gauge: 21     Additional Needles:   Narrative:  Start time: 05/04/2017 2:43 PM End time: 05/04/2017 2:47 PM Injection made incrementally with aspirations every 5 mL.  Performed by: Personally  Anesthesiologist: Beryle LatheBrock, Beckam Abdulaziz E, MD  Additional Notes: No pain on injection. No increased resistance to injection. Injection made in 5cc increments. Good needle visualization. Patient tolerated the procedure well.

## 2017-05-04 NOTE — Telephone Encounter (Signed)
[facility] home health PT for Right TKA.

## 2017-05-04 NOTE — Op Note (Signed)
Total Knee Arthroplasty Procedure Note  Preoperative diagnosis: Right knee osteoarthritis  Postoperative diagnosis:same  Operative procedure: Right total knee arthroplasty. CPT [phone]  Surgeon: N. Glee ArvinMichael Laketa Sandoz, MD  Assist: Oneal GroutMary Lindsey Stanbery, PA-C; necessary for the timely completion of procedure and due to complexity of procedure.  Anesthesia: Spinal, regional  Tourniquet time: 75 mins  Implants used: [employer] Triatholon Femur: PS 7 Tibia: 8 Patella: 38 mm, 10 thick Polyethylene: 9 mm  Indication: Sean BackersLawrence L Adams is a 67 y.o. year old male with a history of knee pain. Having failed conservative management, the patient elected to proceed with a total knee arthroplasty.  We have reviewed the risk and benefits of the surgery and they elected to proceed after voicing understanding.  Procedure:  After informed consent was obtained and understanding of the risk were voiced including but not limited to bleeding, infection, damage to surrounding structures including nerves and vessels, blood clots, leg length inequality and the failure to achieve desired results, the operative extremity was marked with verbal confirmation of the patient in the holding area.   The patient was then brought to the operating room and transported to the operating room table in the supine position.  A tourniquet was applied to the operative extremity around the upper thigh. The operative limb was then prepped and draped in the usual sterile fashion and preoperative antibiotics were administered.  A time out was performed prior to the start of surgery confirming the correct extremity, preoperative antibiotic administration, as well as team members, implants and instruments available for the case. Correct surgical site was also confirmed with preoperative radiographs. The limb was then elevated for exsanguination and the tourniquet was inflated. A midline incision was made and a standard medial parapatellar  approach was performed.  The patella was prepared and sized to a 38 mm.  A cover was placed on the patella for protection from retractors.  We then turned our attention to the femur. Posterior cruciate ligament was sacrificed. Start site was drilled in the femur and the intramedullary distal femoral cutting guide was placed, set at 5 degrees valgus, taking 9 mm of distal resection. The distal cut was made. Osteophytes were then removed. Next, the proximal tibial cutting guide was placed with appropriate slope, varus/valgus alignment and depth of resection. The proximal tibial cut was made. Gap blocks were then used to assess the extension gap and alignment, and appropriate soft tissue releases were performed. Attention was turned back to the femur, which was sized using the sizing guide to a size 7. Appropriate rotation of the femoral component was determined using epicondylar axis, Whiteside's line, and assessing the flexion gap under ligament tension. The appropriate size 4-in-1 cutting block was placed and cuts were made. Posterior femoral osteophytes and uncapped bone were then removed with the curved osteotome. The tibia was sized for a size 8 component. The femoral box-cutting guide was placed and prepared for a PS femoral component. Trial components were placed, and stability was checked in full extension, mid-flexion, and deep flexion. Proper tibial rotation was determined and marked.  The patella tracked well without a lateral release. Trial components were then removed and tibial preparation performed. A posterior capsular injection comprising of 20 cc of 1.3% exparel and 40 cc of normal saline was performed for postoperative pain control. The bony surfaces were irrigated with a pulse lavage and then dried. Bone cement was vacuum mixed on the back table, and the final components sized above were cemented into place. After cement  had finished curing, excess cement was removed. The stability of the  construct was re-evaluated throughout a range of motion and found to be acceptable. The trial liner was removed, the knee was copiously irrigated, and the knee was re-evaluated for any excess bone debris. The real polyethylene liner, 9 mm thick, was inserted and checked to ensure the locking mechanism had engaged appropriately. The tourniquet was deflated and hemostasis was achieved. The wound was irrigated with normal saline.  One gram of vancomycin powder was placed in the surgical bed. A drain was not placed. Capsular closure was performed with a #1 vicryl, subcutaneous fat closed with a 0 vicryl suture, then subcutaneous tissue closed with interrupted 2.0 vicryl suture. The skin was then closed with a 3.0 monocryl. A sterile dressing was applied.  The patient was awakened in the operating room and taken to recovery in stable condition. All sponge, needle, and instrument counts were correct at the end of the case.  Position: supine  Complications: none.  Time Out: performed   Drains/Packing: none  Estimated blood loss: minimal  Returned to Recovery Room: in good condition.   Antibiotics: yes   Mechanical VTE (DVT) Prophylaxis: sequential compression devices, TED thigh-high  Chemical VTE (DVT) Prophylaxis: aspirin  Fluid Replacement  Crystalloid: see anesthesia record Blood: none  FFP: none   Specimens Removed: 1 to pathology   Sponge and Instrument Count Correct? yes   PACU: portable radiograph - knee AP and Lateral   Admission: inpatient status  Plan/RTC: Return in 2 weeks for wound check.   Weight Bearing/Load Lower Extremity: full   N. Glee Arvin, MD [facility] [phone] 4:56 PM

## 2017-05-04 NOTE — Anesthesia Preprocedure Evaluation (Signed)
Anesthesia Evaluation  Patient identified by MRN, date of birth, ID band Patient awake    Reviewed: Allergy & Precautions, NPO status , Patient's Chart, lab work & pertinent test results, reviewed documented beta blocker date and time   Airway Mallampati: II  TM Distance: >3 FB Neck ROM: Full    Dental  (+) Dental Advisory Given   Pulmonary neg pulmonary ROS,    Pulmonary exam normal breath sounds clear to auscultation       Cardiovascular hypertension, Pt. on medications and Pt. on home beta blockers + CAD  Normal cardiovascular exam Rhythm:Regular Rate:Normal  EKG - SB  '04 Cath -  Preserved left ventricular function. 70% ostial stenosis of an obtuse marginal sub-branch. Other mild luminal irregularities (30% ostial first posterolateral stenosis; mid LAD 20-30% irregularity). - DISPOSITION: The patient clearly has a circumflex lesion which is at least moderate. It is a non-ideal spot for percutaneous intervention. Based upon the findings, we are learning towards medical therapy at the present time. He will be treated medically.     Neuro/Psych negative neurological ROS  negative psych ROS   GI/Hepatic Neg liver ROS, GERD  Controlled,  Endo/Other  negative endocrine ROS  Renal/GU negative Renal ROS  negative genitourinary   Musculoskeletal  (+) Arthritis ,   Abdominal   Peds  Hematology negative hematology ROS (+)   Anesthesia Other Findings   Reproductive/Obstetrics                             Anesthesia Physical Anesthesia Plan  ASA: III  Anesthesia Plan: Spinal   Post-op Pain Management:  Regional for Post-op pain   Induction:   PONV Risk Score and Plan: Treatment may vary due to age or medical condition and Propofol infusion  Airway Management Planned: Natural Airway and Nasal Cannula  Additional Equipment: None  Intra-op Plan:   Post-operative Plan:   Informed  Consent: I have reviewed the patients History and Physical, chart, labs and discussed the procedure including the risks, benefits and alternatives for the proposed anesthesia with the patient or authorized representative who has indicated his/her understanding and acceptance.   Dental advisory given  Plan Discussed with: CRNA  Anesthesia Plan Comments:         Anesthesia Quick Evaluation

## 2017-05-04 NOTE — Progress Notes (Signed)
Orthopedic Tech Progress Note Patient Details:  Sean BackersLawrence L Adams 16-Jul-1950 [id number]  CPM Right Knee CPM Right Knee: On Right Knee Flexion (Degrees): 90 Right Knee Extension (Degrees): 0  Post Interventions Patient Tolerated: Well  Jennye MoccasinHughes, Zakk Borgen Craig 05/04/2017, 7:02 PM

## 2017-05-05 ENCOUNTER — Encounter (HOSPITAL_COMMUNITY): Payer: Self-pay | Admitting: Orthopaedic Surgery

## 2017-05-05 LAB — BASIC METABOLIC PANEL
Anion gap: 11 (ref 5–15)
BUN: 12 mg/dL (ref 6–20)
CALCIUM: 9 mg/dL (ref 8.9–10.3)
CO2: 23 mmol/L (ref 22–32)
CREATININE: 1.21 mg/dL (ref 0.61–1.24)
Chloride: 105 mmol/L (ref 101–111)
GFR calc non Af Amer: >60 mL/min (ref >60)
GLUCOSE: 139 mg/dL — AB (ref 65–99)
Potassium: 4.4 mmol/L (ref 3.5–5.1)
Sodium: 139 mmol/L (ref 135–145)

## 2017-05-05 LAB — CBC
HEMATOCRIT: 41.3 % (ref 39.0–52.0)
Hemoglobin: 13.7 g/dL (ref 13.0–17.0)
MCH: 29.4 pg (ref 26.0–34.0)
MCHC: 33.2 g/dL (ref 30.0–36.0)
MCV: 88.6 fL (ref 78.0–100.0)
Platelets: 189 10*3/uL (ref 150–400)
RBC: 4.66 MIL/uL (ref 4.22–5.81)
RDW: 14.6 % (ref 11.5–15.5)
WBC: 9.3 10*3/uL (ref 4.0–10.5)

## 2017-05-05 MED FILL — Vancomycin HCl For IV Soln 1 GM (Base Equivalent): INTRAVENOUS | Qty: 1000 | Status: AC

## 2017-05-05 NOTE — Progress Notes (Signed)
Physical Therapy Treatment Patient Details Name: Sean BackersLawrence L Thunder MRN: [id number] DOB: 29-Aug-1950 Today's Date: 05/05/2017    History of Present Illness 67 y.o. male admitted on 05/04/17 for elective R TKA.  Pt with significant PMH of HTN, and CAD.    PT Comments    Patient able to negotiate one step appropriate for home entry and demonstrated safe ambulation on unit with RW and discussed car transfer.  Feel he is safe for d/c home with wife assist if medically ready per MD.   Follow Up Recommendations  Follow surgeon's recommendation for DC plan and follow-up therapies;Supervision for mobility/OOB     Equipment Recommendations  Rolling walker with 5" wheels;3in1 (PT)    Recommendations for Other Services       Precautions / Restrictions Precautions Precautions: Knee Precaution Comments: reviewed precautions Restrictions RLE Weight Bearing: Weight bearing as tolerated    Mobility  Bed Mobility Overal bed mobility: Modified Independent                Transfers       Sit to Stand: Supervision;From elevated surface         General transfer comment: supervision for safety   Ambulation/Gait Ambulation/Gait assistance: Supervision Ambulation Distance (Feet): 400 Feet Assistive device: Rolling walker (2 wheeled) Gait Pattern/deviations: Step-through pattern;Decreased stride length     General Gait Details: no LOB, good heel to toe pattern   Stairs Stairs: Yes   Stair Management: Forwards;With walker Number of Stairs: 1 General stair comments: performed x 2 trials with pt able to reproduce after first trial without cues  Wheelchair Mobility    Modified Rankin (Stroke Patients Only)       Balance Overall balance assessment: Mild deficits observed, not formally tested                                          Cognition Arousal/Alertness: Awake/alert Behavior During Therapy: WFL for tasks assessed/performed Overall Cognitive  Status: Within Functional Limits for tasks assessed                                        Exercises      General Comments General comments (skin integrity, edema, etc.): educated/simulated car transfer into truck with running board      Pertinent Vitals/Pain Pain Assessment: 0-10 Pain Score: 2  Pain Location: R knee with ambulation Pain Descriptors / Indicators: Discomfort;Sore Pain Intervention(s): Monitored during session;Repositioned;Ice applied    Home Living                      Prior Function            PT Goals (current goals can now be found in the care plan section) Progress towards PT goals: Progressing toward goals    Frequency    7X/week      PT Plan Current plan remains appropriate    Co-evaluation              AM-PAC PT "6 Clicks" Daily Activity  Outcome Measure  Difficulty turning over in bed (including adjusting bedclothes, sheets and blankets)?: None Difficulty moving from lying on back to sitting on the side of the bed? : None Difficulty sitting down on and standing up from a chair with arms (e.g., wheelchair, bedside  commode, etc,.)?: A Little Help needed moving to and from a bed to chair (including a wheelchair)?: None Help needed walking in hospital room?: None Help needed climbing 3-5 steps with a railing? : A Little 6 Click Score: 22    End of Session   Activity Tolerance: Patient tolerated treatment well Patient left: in bed;with family/visitor present Nurse Communication: Other (comment)(MD in hallwy and notified of edema and blood under dressing) PT Visit Diagnosis: Muscle weakness (generalized) (M62.81);Difficulty in walking, not elsewhere classified (R26.2);Pain Pain - Right/Left: Right Pain - part of body: Knee     Time: 1535-1601 PT Time Calculation (min) (ACUTE ONLY): 26 min  Charges:  $Gait Training: 8-22 mins $Self Care/Home Management: 8-22                    G CodesSheran Lawless, [place] [phone] 05/05/2017    Elray Mcgregor 05/05/2017, 4:34 PM

## 2017-05-05 NOTE — Anesthesia Postprocedure Evaluation (Signed)
Anesthesia Post Note  Patient: Sean Adams  Procedure(s) Performed: RIGHT TOTAL KNEE ARTHROPLASTY (Right )     Patient location during evaluation: PACU Anesthesia Type: Spinal and Regional Level of consciousness: oriented and awake and alert Pain management: pain level controlled Vital Signs Assessment: post-procedure vital signs reviewed and stable Respiratory status: spontaneous breathing and respiratory function stable Cardiovascular status: blood pressure returned to baseline and stable Postop Assessment: no headache, no backache and no apparent nausea or vomiting Anesthetic complications: no    Last Vitals:  Vitals:   05/05/17 0049 05/05/17 0432  BP: (!) 156/89 (!) 162/96  Pulse: 91 91  Resp: 17 17  Temp: 37 C 36.9 C  SpO2: 100% 100%    Last Pain:  Vitals:   05/05/17 0638  TempSrc:   PainSc: 8                  Clary Boulais,W. EDMOND

## 2017-05-05 NOTE — Discharge Summary (Signed)
Physician Discharge Summary      Patient ID: Sean Adams MRN: [id number] DOB/AGE: [birth year] 67 y.o.  Admit date: 05/04/2017 Discharge date: 05/05/2017  Admission Diagnoses:  <principal problem not specified>  Discharge Diagnoses:  Active Problems:   Total knee replacement status   Past Medical History:  Diagnosis Date  . Allergic rhinitis   . Arthritis   . Coronary artery disease   . GERD (gastroesophageal reflux disease)   . Hemorrhoids   . Hyperlipidemia   . Hypertension     Surgeries: Procedure(s): RIGHT TOTAL KNEE ARTHROPLASTY on 05/04/2017   Consultants (if any):   Discharged Condition: Improved  Hospital Course: Sean Adams is an 67 y.o. male who was admitted 05/04/2017 with a diagnosis of <principal problem not specified> and went to the operating room on 05/04/2017 and underwent the above named procedures.    He was given perioperative antibiotics:  Anti-infectives (From admission, onward)   Start     Dose/Rate Route Frequency Ordered Stop   05/04/17 2200  sulfamethoxazole-trimethoprim (BACTRIM DS,SEPTRA DS) 800-160 MG per tablet 1 tablet     1 tablet Oral Every 12 hours 05/04/17 2015     05/04/17 2030  ceFAZolin (ANCEF) 3 g in dextrose 5 % 50 mL IVPB     3 g 130 mL/hr over 30 Minutes Intravenous Every 6 hours 05/04/17 2015 05/05/17 1032   05/04/17 1300  ceFAZolin (ANCEF) IVPB 2g/100 mL premix     2 g 200 mL/hr over 30 Minutes Intravenous To ShortStay Surgical 05/03/17 1019 05/04/17 1505   05/04/17 0000  sulfamethoxazole-trimethoprim (BACTRIM DS,SEPTRA DS) 800-160 MG tablet     1 tablet Oral 2 times daily 05/04/17 1702      .  He was given sequential compression devices, early ambulation, and aspirin for DVT prophylaxis.  He benefited maximally from the hospital stay and there were no complications.    Recent vital signs:  Vitals:   05/05/17 0952 05/05/17 0958  BP: (!) 149/78   Pulse:  81  Resp:    Temp:    SpO2:      Recent laboratory  studies:  Lab Results  Component Value Date   HGB 13.7 05/05/2017   HGB 15.7 04/27/2017   Lab Results  Component Value Date   WBC 9.3 05/05/2017   PLT 189 05/05/2017   Lab Results  Component Value Date   INR 1.04 04/27/2017   Lab Results  Component Value Date   NA 139 05/05/2017   K 4.4 05/05/2017   CL 105 05/05/2017   CO2 23 05/05/2017   BUN 12 05/05/2017   CREATININE 1.21 05/05/2017   GLUCOSE 139 (H) 05/05/2017    Discharge Medications:   Allergies as of 05/05/2017   No Known Allergies     Medication List    TAKE these medications   aspirin EC 325 MG tablet Take 1 tablet (325 mg total) by mouth 2 (two) times daily.   hydrocortisone 2.5 % cream Apply 1 application topically 2 (two) times daily as needed (for fungus).   lisinopril 40 MG tablet Commonly known as:  PRINIVIL,ZESTRIL Take 40 mg by mouth daily.   loratadine 10 MG tablet Commonly known as:  CLARITIN Take 10 mg by mouth daily as needed for allergies.   MAGNESIUM MALATE PO Take 1 tablet by mouth daily.   metoprolol tartrate 25 MG tablet Commonly known as:  LOPRESSOR Take 12.5 mg by mouth 2 (two) times daily.   naproxen 500 MG tablet Commonly known as:  NAPROSYN Take 500 mg by mouth daily as needed for moderate pain.   OMEGA-3 FISH OIL-VITAMIN D3 PO Take 1 capsule by mouth 2 (two) times daily.   ondansetron 4 MG tablet Commonly known as:  ZOFRAN Take 1-2 tablets (4-8 mg total) by mouth every 8 (eight) hours as needed for nausea or vomiting.   oxyCODONE 10 mg 12 hr tablet Commonly known as:  OXYCONTIN Take 1 tablet (10 mg total) by mouth every 12 (twelve) hours.   oxyCODONE 5 MG immediate release tablet Commonly known as:  Oxy IR/ROXICODONE Take 1-3 tablets (5-15 mg total) by mouth every 4 (four) hours as needed.   PROCTOSOL HC 2.5 % rectal cream Generic drug:  hydrocortisone Place 1 application rectally 2 (two) times daily as needed for hemorrhoids or anal itching.   promethazine 25  MG tablet Commonly known as:  PHENERGAN Take 1 tablet (25 mg total) by mouth every 6 (six) hours as needed for nausea.   senna-docusate 8.6-50 MG tablet Commonly known as:  SENOKOT S Take 1 tablet by mouth at bedtime as needed.   sulfamethoxazole-trimethoprim 800-160 MG tablet Commonly known as:  BACTRIM DS,SEPTRA DS Take 1 tablet by mouth 2 (two) times daily.   tiZANidine 4 MG tablet Commonly known as:  ZANAFLEX Take 1 tablet (4 mg total) by mouth every 6 (six) hours as needed for muscle spasms.            Durable Medical Equipment  (From admission, onward)        Start     Ordered   05/04/17 2016  DME Walker rolling  Once    Question:  Patient needs a walker to treat with the following condition  Answer:  Total knee replacement status   05/04/17 2015   05/04/17 2016  DME 3 n 1  Once     05/04/17 2015   05/04/17 2016  DME Bedside commode  Once    Question:  Patient needs a bedside commode to treat with the following condition  Answer:  Total knee replacement status   05/04/17 2015      Diagnostic Studies: Dg Chest 2 View  Result Date: 04/27/2017 CLINICAL DATA:  Preoperative for total knee replacement. Hypertension. EXAM: CHEST  2 VIEW COMPARISON:  None. FINDINGS: There is no edema or consolidation. Heart is upper normal in size with pulmonary vascularity within normal limits. No adenopathy. There is aortic atherosclerosis. No bone lesions. IMPRESSION: Aortic atherosclerosis.  No edema or consolidation. Aortic Atherosclerosis (ICD10-I70.0). Electronically Signed   By: Bretta Bang III M.D.   On: 04/27/2017 19:50   Dg Knee Right Port  Result Date: 05/04/2017 CLINICAL DATA:  Status post right knee replacement EXAM: PORTABLE RIGHT KNEE - 1-2 VIEW COMPARISON:  None. FINDINGS: Right knee prosthesis is seen. Air is noted in the surgical bed. No acute bony or soft tissue abnormality is noted. IMPRESSION: Status post right knee replacement.  No acute abnormality noted.  Electronically Signed   By: Alcide Clever M.D.   On: 05/04/2017 19:43    Disposition:   Discharge Instructions    Call MD / Call 911   Complete by:  As directed    If you experience chest pain or shortness of breath, CALL 911 and be transported to the hospital emergency room.  If you develope a fever above 101.5 F, pus (white drainage) or increased drainage or redness at the wound, or calf pain, call your surgeon's office.   Constipation Prevention   Complete by:  As directed    Drink plenty of fluids.  Prune juice may be helpful.  You may use a stool softener, such as Colace (over the counter) 100 mg twice a day.  Use MiraLax (over the counter) for constipation as needed.   Driving restrictions   Complete by:  As directed    No driving while taking narcotic pain meds.   Increase activity slowly as tolerated   Complete by:  As directed    Place in observation (patient's expected length of stay will be less than 2 midnights)   Complete by:  As directed       Follow-up Information    Tarry KosXu, Axtyn Woehler M, MD In 2 weeks.   Specialty:  Orthopedic Surgery Why:  For suture removal, For wound re-check Contact information: [address] [place] [id number] [phone]            Signed: Glee ArvinMichael Semir Brill 05/05/2017, 3:51 PM

## 2017-05-05 NOTE — Care Management Note (Signed)
Case Management Note  Patient Details  Name: Sean Adams MRN: [id number] Date of Birth: 11/18/1950  Subjective/Objective:   67 yr old gentleman s/p right total knee arthroplasty,.                 Action/Plan: Case manager spoke with patient and his wife concerning discharge plan and DME. Patient is under [employer]. Case Manager contacted his primary Dr.'s office- Dr. Leonie DouglasStephanie Borum [phone], faxed orders for  HHPT, RW and 3in1 [phone]. Case manager is also trying to reach Clinical Case Manager Sean Adams [phone]. Message was left with Diplomatic Services operational officersecretary.    Expected Discharge Date:    05/06/17              Expected Discharge Plan:  Home w Home Health Services  In-House Referral:  NA  Discharge planning Services  CM Consult  Post Acute Care Choice:  Durable Medical Equipment, Home Health Choice offered to:  ([facility] to arrange )  DME Arranged:  3-N-1, Walker rolling DME Agency:  (to be arranged by [facility])  HH Arranged:  PT HH Agency:  (to be arranged by [facility])  Status of Service:  In process, will continue to follow  If discussed at Long Length of Stay Meetings, dates discussed:    Additional Comments:  Durenda GuthrieBrady, Laneice Meneely Naomi, RN 05/05/2017, 12:39 PM

## 2017-05-05 NOTE — Evaluation (Signed)
Physical Therapy Evaluation Patient Details Name: Sean BackersLawrence L Minter MRN: [id number] DOB: 1950/11/14 Today's Date: 05/05/2017   History of Present Illness  67 y.o. male admitted on 05/04/17 for elective R TKA.  Pt with significant PMH of HTN, and CAD.  Clinical Impression  Pt is POD #1 and is moving well.  He is supervision overall with a long hallway walk with RW.  He practiced his entire HEP and we reviewed his knee precautions.  He will need to practice a curb step this afternoon to be safe to d/c home with his wife (he is hopeful for later today and I think that is fine from a mobility standpoint).  He did have some fresh blood on his dressing after we walked and exercised.  RN made aware.   PT to follow acutely for deficits listed below.       Follow Up Recommendations Follow surgeon's recommendation for DC plan and follow-up therapies;Supervision for mobility/OOB    Equipment Recommendations  Rolling walker with 5" wheels;3in1 (PT)    Recommendations for Other Services   NA    Precautions / Restrictions Precautions Precautions: Knee Precaution Booklet Issued: Yes (comment) Precaution Comments: knee exercise handout given and exercises reviewed, no pillow under surgical knee reviewed as well.  Restrictions Weight Bearing Restrictions: Yes RLE Weight Bearing: Weight bearing as tolerated      Mobility  Bed Mobility Overal bed mobility: Modified Independent                Transfers Overall transfer level: Needs assistance   Transfers: Sit to/from Stand Sit to Stand: Supervision;From elevated surface(pt is 6'3)         General transfer comment: supervision for safety and verbal cues for safe hand placement.   Ambulation/Gait Ambulation/Gait assistance: Supervision Ambulation Distance (Feet): 200 Feet Assistive device: Rolling walker (2 wheeled) Gait Pattern/deviations: Step-through pattern;Antalgic     General Gait Details: Pt with milldy antalgic gait  pattern, improved with increased distance, good heel to toe stride.          Balance Overall balance assessment: Mild deficits observed, not formally tested                                           Pertinent Vitals/Pain Pain Assessment: Faces Faces Pain Scale: Hurts even more Pain Location: right knee with flexion ROM Pain Descriptors / Indicators: Grimacing;Guarding Pain Intervention(s): Limited activity within patient's tolerance;Monitored during session;Repositioned    Home Living Family/patient expects to be discharged to:: Private residence Living Arrangements: Spouse/significant other Available Help at Discharge: Family;Available 24 hours/day Type of Home: House Home Access: Level entry     Home Layout: One level;Other (Comment)(one step up to get to his bathroom) Home Equipment: None      Prior Function Level of Independence: Independent         Comments: 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      Hand Dominance   Dominant Hand: Right    Extremity/Trunk Assessment   Upper Extremity Assessment Upper Extremity Assessment: Overall WFL for tasks assessed    Lower Extremity Assessment Lower Extremity Assessment: RLE deficits/detail RLE Deficits / Details: right knee with normal post op pain and weakness, ankle at least 3/5, knee 3-/5 extension, hip flexion 3-/5,  RLE Sensation: WNL    Cervical / Trunk Assessment Cervical / Trunk Assessment: Normal  Communication  Communication: No difficulties  Cognition Arousal/Alertness: Awake/alert Behavior During Therapy: WFL for tasks assessed/performed Overall Cognitive Status: Within Functional Limits for tasks assessed                                           Exercises Total Joint Exercises Ankle Circles/Pumps: AROM;Both;20 reps Quad Sets: AROM;Both;10 reps Towel Squeeze: AROM;Both;10 reps Short Arc Quad: AROM;Right;10 reps Heel Slides: AROM;Right;10  reps Hip ABduction/ADduction: AROM;Right;10 reps Straight Leg Raises: AROM;Right;10 reps Long Arc Quad: AROM;Right;10 reps Knee Flexion: AROM;Right;10 reps;Seated   Assessment/Plan    PT Assessment Patient needs continued PT services  PT Problem List Decreased strength;Decreased range of motion;Decreased activity tolerance;Decreased balance;Decreased mobility;Decreased knowledge of use of DME;Decreased knowledge of precautions;Pain       PT Treatment Interventions DME instruction;Gait training;Stair training;Functional mobility training;Therapeutic activities;Therapeutic exercise;Balance training;Neuromuscular re-education;Patient/family education;Manual techniques;Modalities    PT Goals (Current goals can be found in the Care Plan section)  Acute Rehab PT Goals Patient Stated Goal: to go home later today if he can PT Goal Formulation: With patient/family Time For Goal Achievement: 05/12/17 Potential to Achieve Goals: Good    Frequency 7X/week           AM-PAC PT "6 Clicks" Daily Activity  Outcome Measure Difficulty turning over in bed (including adjusting bedclothes, sheets and blankets)?: None Difficulty moving from lying on back to sitting on the side of the bed? : None Difficulty sitting down on and standing up from a chair with arms (e.g., wheelchair, bedside commode, etc,.)?: None Help needed moving to and from a bed to chair (including a wheelchair)?: None Help needed walking in hospital room?: None Help needed climbing 3-5 steps with a railing? : A Little 6 Click Score: 23    End of Session   Activity Tolerance: Patient tolerated treatment well Patient left: in chair;with call bell/phone within reach;with family/visitor present Nurse Communication: Mobility status;Other (comment)(some fresh blood on his dressing, and IV leaking from site) PT Visit Diagnosis: Muscle weakness (generalized) (M62.81);Difficulty in walking, not elsewhere classified (R26.2);Pain Pain -  Right/Left: Right Pain - part of body: Knee    Time: 1610-9604 PT Time Calculation (min) (ACUTE ONLY): 31 min   Charges:       Lurena Joiner B. Palmina Clodfelter, PT, DPT [phone]    PT Evaluation $PT Eval Moderate Complexity: 1 Mod PT Treatments $Gait Training: 8-22 mins   05/05/2017, 11:22 AM

## 2017-05-05 NOTE — Progress Notes (Signed)
   Subjective:  Patient reports pain as mild.  No events.  Objective:   VITALS:   Vitals:   05/04/17 2047 05/04/17 2318 05/05/17 0049 05/05/17 0432  BP: (!) 193/93 (!) 145/65 (!) 156/89 (!) 162/96  Pulse: 81  91 91  Resp: 17  17 17   Temp: 97.7 F (36.5 C)  98.6 F (37 C) 98.5 F (36.9 C)  TempSrc: Oral  Oral Oral  SpO2: 100%  100% 100%    Neurologically intact Neurovascular intact Sensation intact distally Intact pulses distally Dorsiflexion/Plantar flexion intact Incision: dressing C/D/I and no drainage No cellulitis present Compartment soft   Lab Results  Component Value Date   WBC 4.4 04/27/2017   HGB 15.7 04/27/2017   HCT 46.2 04/27/2017   MCV 87.7 04/27/2017   PLT 197 04/27/2017     Assessment/Plan:  1 Day Post-Op   - Expected postop acute blood loss anemia - will monitor for symptoms - Up with PT/OT - DVT ppx - SCDs, ambulation, aspirin - WBAT operative extremity - Pain control - Discharge planning - possible dc this afternoon vs tomorrow  Glee ArvinMichael Xu 05/05/2017, 7:35 AM [phone]

## 2017-05-09 ENCOUNTER — Telehealth (INDEPENDENT_AMBULATORY_CARE_PROVIDER_SITE_OTHER): Payer: Self-pay | Admitting: Orthopaedic Surgery

## 2017-05-09 NOTE — Telephone Encounter (Signed)
yes

## 2017-05-09 NOTE — Telephone Encounter (Signed)
Patients wife called on behalf of patient stating that he had surgery last week with Dr. Roda ShuttersXu and was suppose to have a home health aid and they have no been contacted. She would like to speak with you. CB # [phone]

## 2017-05-09 NOTE — Telephone Encounter (Signed)
Okay to fax Rx for HHPT

## 2017-05-09 NOTE — Telephone Encounter (Signed)
FAXED TO FAX NUMBER GIVEN AND ALSO FAXED TO [facility].

## 2017-05-09 NOTE — Telephone Encounter (Signed)
Patient's wife called back to give us the fax number to [facility].  It is [phone]

## 2017-05-11 ENCOUNTER — Telehealth (INDEPENDENT_AMBULATORY_CARE_PROVIDER_SITE_OTHER): Payer: Self-pay | Admitting: Orthopaedic Surgery

## 2017-05-11 NOTE — Telephone Encounter (Signed)
Kreg ShropshireKate White [facility] asking for verbal [facility] on PT for 3 week 2 and also [place]. CB # [phone]

## 2017-05-11 NOTE — Telephone Encounter (Signed)
yes

## 2017-05-11 NOTE — Telephone Encounter (Signed)
Called Jae DireKate to advise

## 2017-05-11 NOTE — Telephone Encounter (Signed)
See message below.   (Sherrie is also working on getting HHPT orders approved. Dorene SorrowJerry came by today to let us know we need to get [place] the [place])

## 2017-05-17 ENCOUNTER — Ambulatory Visit (INDEPENDENT_AMBULATORY_CARE_PROVIDER_SITE_OTHER): Payer: No Typology Code available for payment source | Admitting: Orthopaedic Surgery

## 2017-05-17 ENCOUNTER — Encounter (INDEPENDENT_AMBULATORY_CARE_PROVIDER_SITE_OTHER): Payer: Self-pay | Admitting: Orthopaedic Surgery

## 2017-05-17 DIAGNOSIS — Z96651 Presence of right artificial knee joint: Secondary | ICD-10-CM

## 2017-05-17 MED ORDER — TRAMADOL HCL 50 MG PO TABS: ORAL_TABLET | ORAL | 0 refills | Status: DC
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
## 2017-06-13 ENCOUNTER — Telehealth (INDEPENDENT_AMBULATORY_CARE_PROVIDER_SITE_OTHER): Payer: Self-pay

## 2017-06-13 NOTE — Telephone Encounter (Signed)
Can you make appt depending when patient can come in please. Thank you.

## 2017-06-13 NOTE — Telephone Encounter (Signed)
See message below,

## 2017-06-13 NOTE — Telephone Encounter (Signed)
Sure, [facility]

## 2017-06-13 NOTE — Telephone Encounter (Signed)
Wife called stating 5 nights last week pt woke up with night sweats. Has not had any the last 2 nights. Went to PT today and therapist said he could have some infection. Asked wife if he checked his temp and she said no. He has f/u scheduled for 06/16/17. Wants to know if he should come in sooner?

## 2017-06-16 ENCOUNTER — Encounter (INDEPENDENT_AMBULATORY_CARE_PROVIDER_SITE_OTHER): Payer: Self-pay | Admitting: Orthopaedic Surgery

## 2017-06-16 ENCOUNTER — Ambulatory Visit (INDEPENDENT_AMBULATORY_CARE_PROVIDER_SITE_OTHER): Payer: Non-veteran care | Admitting: Orthopaedic Surgery

## 2017-06-16 ENCOUNTER — Ambulatory Visit (INDEPENDENT_AMBULATORY_CARE_PROVIDER_SITE_OTHER): Payer: Non-veteran care

## 2017-06-16 DIAGNOSIS — Z96651 Presence of right artificial knee joint: Secondary | ICD-10-CM | POA: Diagnosis not present
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
## 2017-07-26 ENCOUNTER — Encounter (INDEPENDENT_AMBULATORY_CARE_PROVIDER_SITE_OTHER): Payer: Self-pay | Admitting: Orthopaedic Surgery

## 2017-07-26 ENCOUNTER — Ambulatory Visit (INDEPENDENT_AMBULATORY_CARE_PROVIDER_SITE_OTHER): Payer: Non-veteran care | Admitting: Orthopaedic Surgery

## 2017-07-26 DIAGNOSIS — Z96651 Presence of right artificial knee joint: Secondary | ICD-10-CM
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
## 2017-07-28 ENCOUNTER — Ambulatory Visit (INDEPENDENT_AMBULATORY_CARE_PROVIDER_SITE_OTHER): Payer: No Typology Code available for payment source | Admitting: Orthopaedic Surgery

## 2017-10-25 ENCOUNTER — Encounter (INDEPENDENT_AMBULATORY_CARE_PROVIDER_SITE_OTHER): Payer: Self-pay | Admitting: Orthopaedic Surgery

## 2017-10-25 ENCOUNTER — Ambulatory Visit (INDEPENDENT_AMBULATORY_CARE_PROVIDER_SITE_OTHER): Payer: PRIVATE HEALTH INSURANCE | Admitting: Orthopaedic Surgery

## 2017-10-25 ENCOUNTER — Ambulatory Visit (INDEPENDENT_AMBULATORY_CARE_PROVIDER_SITE_OTHER): Payer: PRIVATE HEALTH INSURANCE

## 2017-10-25 DIAGNOSIS — Z96651 Presence of right artificial knee joint: Secondary | ICD-10-CM
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
## 2018-04-25 ENCOUNTER — Emergency Department (HOSPITAL_COMMUNITY): Payer: Non-veteran care

## 2018-04-25 ENCOUNTER — Ambulatory Visit (INDEPENDENT_AMBULATORY_CARE_PROVIDER_SITE_OTHER): Payer: PRIVATE HEALTH INSURANCE | Admitting: Orthopaedic Surgery

## 2018-04-25 ENCOUNTER — Telehealth (INDEPENDENT_AMBULATORY_CARE_PROVIDER_SITE_OTHER): Payer: Self-pay | Admitting: Orthopaedic Surgery

## 2018-04-25 ENCOUNTER — Observation Stay (HOSPITAL_COMMUNITY)
Admission: EM | Admit: 2018-04-25 | Discharge: 2018-04-26 | Disposition: A | Discharge status: Home / Self Care | Payer: No Typology Code available for payment source | Attending: Family Medicine | Admitting: Family Medicine

## 2018-04-25 ENCOUNTER — Other Ambulatory Visit: Payer: Self-pay

## 2018-04-25 ENCOUNTER — Encounter (HOSPITAL_COMMUNITY): Payer: Self-pay

## 2018-04-25 DIAGNOSIS — Z8673 Personal history of transient ischemic attack (TIA), and cerebral infarction without residual deficits: Secondary | ICD-10-CM | POA: Insufficient documentation

## 2018-04-25 DIAGNOSIS — I10 Essential (primary) hypertension: Secondary | ICD-10-CM | POA: Diagnosis present

## 2018-04-25 DIAGNOSIS — R42 Dizziness and giddiness: Secondary | ICD-10-CM | POA: Insufficient documentation

## 2018-04-25 DIAGNOSIS — I160 Hypertensive urgency: Principal | ICD-10-CM | POA: Insufficient documentation

## 2018-04-25 DIAGNOSIS — Z8249 Family history of ischemic heart disease and other diseases of the circulatory system: Secondary | ICD-10-CM | POA: Insufficient documentation

## 2018-04-25 DIAGNOSIS — R001 Bradycardia, unspecified: Secondary | ICD-10-CM | POA: Insufficient documentation

## 2018-04-25 DIAGNOSIS — Z87891 Personal history of nicotine dependence: Secondary | ICD-10-CM | POA: Insufficient documentation

## 2018-04-25 DIAGNOSIS — Z791 Long term (current) use of non-steroidal anti-inflammatories (NSAID): Secondary | ICD-10-CM | POA: Insufficient documentation

## 2018-04-25 DIAGNOSIS — E7800 Pure hypercholesterolemia, unspecified: Secondary | ICD-10-CM | POA: Diagnosis not present

## 2018-04-25 DIAGNOSIS — J309 Allergic rhinitis, unspecified: Secondary | ICD-10-CM | POA: Insufficient documentation

## 2018-04-25 DIAGNOSIS — Z79899 Other long term (current) drug therapy: Secondary | ICD-10-CM | POA: Insufficient documentation

## 2018-04-25 DIAGNOSIS — K219 Gastro-esophageal reflux disease without esophagitis: Secondary | ICD-10-CM | POA: Insufficient documentation

## 2018-04-25 DIAGNOSIS — I639 Cerebral infarction, unspecified: Secondary | ICD-10-CM | POA: Insufficient documentation

## 2018-04-25 DIAGNOSIS — I2510 Atherosclerotic heart disease of native coronary artery without angina pectoris: Secondary | ICD-10-CM | POA: Insufficient documentation

## 2018-04-25 DIAGNOSIS — E785 Hyperlipidemia, unspecified: Secondary | ICD-10-CM | POA: Insufficient documentation

## 2018-04-25 DIAGNOSIS — R3915 Urgency of urination: Secondary | ICD-10-CM | POA: Insufficient documentation

## 2018-04-25 LAB — CBC
HCT: 47.8 % (ref 39.0–52.0)
HCT: 45.5 % (ref 39.0–52.0)
HEMOGLOBIN: 15.1 g/dL (ref 13.0–17.0)
Hemoglobin: 14.7 g/dL (ref 13.0–17.0)
MCH: 28.3 pg (ref 26.0–34.0)
MCH: 28.5 pg (ref 26.0–34.0)
MCHC: 31.6 g/dL (ref 30.0–36.0)
MCHC: 32.3 g/dL (ref 30.0–36.0)
MCV: 89.5 fL (ref 80.0–100.0)
MCV: 88.3 fL (ref 80.0–100.0)
NRBC: 0 % (ref 0.0–0.2)
PLATELETS: 182 10*3/uL (ref 150–400)
Platelets: 167 10*3/uL (ref 150–400)
RBC: 5.34 MIL/uL (ref 4.22–5.81)
RBC: 5.15 MIL/uL (ref 4.22–5.81)
RDW: 14.1 % (ref 11.5–15.5)
RDW: 14.1 % (ref 11.5–15.5)
WBC: 3.8 10*3/uL — AB (ref 4.0–10.5)
WBC: 3.9 10*3/uL — ABNORMAL LOW (ref 4.0–10.5)
nRBC: 0 % (ref 0.0–0.2)

## 2018-04-25 LAB — LIPID PANEL
Cholesterol: 118 mg/dL (ref 0–200)
HDL: 32 mg/dL — ABNORMAL LOW (ref >40)
LDL Cholesterol: 76 mg/dL (ref 0–99)
Total CHOL/HDL Ratio: 3.7 RATIO
Triglycerides: 51 mg/dL (ref <150)
VLDL: 10 mg/dL (ref 0–40)

## 2018-04-25 LAB — BASIC METABOLIC PANEL
Anion gap: 10 (ref 5–15)
BUN: 11 mg/dL (ref 8–23)
CO2: 21 mmol/L — ABNORMAL LOW (ref 22–32)
Calcium: 8.8 mg/dL — ABNORMAL LOW (ref 8.9–10.3)
Chloride: 107 mmol/L (ref 98–111)
Creatinine, Ser: 0.96 mg/dL (ref 0.61–1.24)
GFR calc Af Amer: >60 mL/min (ref >60)
Glucose, Bld: 121 mg/dL — ABNORMAL HIGH (ref 70–99)
POTASSIUM: 4.3 mmol/L (ref 3.5–5.1)
SODIUM: 138 mmol/L (ref 135–145)

## 2018-04-25 LAB — CREATININE, SERUM
Creatinine, Ser: 1.02 mg/dL (ref 0.61–1.24)
GFR calc Af Amer: >60 mL/min (ref >60)
GFR calc non Af Amer: >60 mL/min (ref >60)

## 2018-04-25 LAB — I-STAT TROPONIN, ED: Troponin i, poc: 0.01 ng/mL (ref 0.00–0.08)

## 2018-04-25 LAB — TSH: TSH: 1.676 u[IU]/mL (ref 0.350–4.500)

## 2018-04-25 LAB — HEMOGLOBIN A1C
Hgb A1c MFr Bld: 5.3 % (ref 4.8–5.6)
Mean Plasma Glucose: 105.41 mg/dL

## 2018-04-25 MED ORDER — LOSARTAN POTASSIUM 50 MG PO TABS: 50.0000 mg | ORAL_TABLET | Freq: Every day | ORAL | Status: DC

## 2018-04-25 MED ORDER — ACETAMINOPHEN 650 MG RE SUPP: 650.0000 mg | Freq: Four times a day (QID) | RECTAL | Status: DC | PRN

## 2018-04-25 MED ORDER — AMLODIPINE BESYLATE 5 MG PO TABS
5.0000 mg | ORAL_TABLET | Freq: Every day | ORAL | Status: DC
  Administered 2018-04-25: 5 mg via ORAL
  Filled 2018-04-25: qty 1

## 2018-04-25 MED ORDER — ENOXAPARIN SODIUM 40 MG/0.4ML ~~LOC~~ SOLN
40.0000 mg | SUBCUTANEOUS | Status: DC
  Filled 2018-04-25: qty 0.4

## 2018-04-25 MED ORDER — FAMOTIDINE 20 MG PO TABS
20.0000 mg | ORAL_TABLET | Freq: Two times a day (BID) | ORAL | Status: DC
  Administered 2018-04-25 – 2018-04-26 (×3): 20 mg via ORAL
  Filled 2018-04-25 (×3): qty 1

## 2018-04-25 MED ORDER — ACETAMINOPHEN 325 MG PO TABS
650.0000 mg | ORAL_TABLET | Freq: Four times a day (QID) | ORAL | Status: DC | PRN
  Administered 2018-04-25 – 2018-04-26 (×2): 650 mg via ORAL
  Filled 2018-04-25 (×2): qty 2

## 2018-04-25 MED ORDER — HYDRALAZINE HCL 20 MG/ML IJ SOLN
5.0000 mg | Freq: Four times a day (QID) | INTRAMUSCULAR | Status: DC | PRN
  Administered 2018-04-25: 5 mg via INTRAVENOUS
  Filled 2018-04-25: qty 1

## 2018-04-25 MED ORDER — LORATADINE 10 MG PO TABS: 10.0000 mg | ORAL_TABLET | Freq: Every day | ORAL | Status: DC | PRN

## 2018-04-25 MED ORDER — LOSARTAN POTASSIUM 50 MG PO TABS: 100.0000 mg | ORAL_TABLET | Freq: Every day | ORAL | Status: DC

## 2018-04-25 MED ORDER — ASPIRIN EC 81 MG PO TBEC
81.0000 mg | DELAYED_RELEASE_TABLET | Freq: Every day | ORAL | Status: DC
  Administered 2018-04-25 – 2018-04-26 (×2): 81 mg via ORAL
  Filled 2018-04-25 (×2): qty 1

## 2018-04-25 MED ORDER — LOSARTAN POTASSIUM 50 MG PO TABS
100.0000 mg | ORAL_TABLET | Freq: Every day | ORAL | Status: DC
  Administered 2018-04-26: 100 mg via ORAL
  Filled 2018-04-25: qty 2

## 2018-04-25 MED ORDER — SODIUM CHLORIDE 0.9 % IV BOLUS
500.0000 mL | Freq: Once | INTRAVENOUS | Status: AC
  Administered 2018-04-25: 500 mL via INTRAVENOUS

## 2018-04-25 MED ORDER — CHLORTHALIDONE 25 MG PO TABS
25.0000 mg | ORAL_TABLET | Freq: Every day | ORAL | Status: DC
  Administered 2018-04-26: 25 mg via ORAL
  Filled 2018-04-25: qty 1

## 2018-04-25 MED ORDER — LOSARTAN POTASSIUM 50 MG PO TABS
50.0000 mg | ORAL_TABLET | Freq: Once | ORAL | Status: AC
  Administered 2018-04-25: 50 mg via ORAL
  Filled 2018-04-25: qty 1

## 2018-04-25 MED ORDER — SODIUM CHLORIDE 0.9% FLUSH: 3.0000 mL | Freq: Once | INTRAVENOUS | Status: DC

## 2018-04-25 MED ORDER — METOPROLOL TARTRATE 5 MG/5ML IV SOLN
5.0000 mg | Freq: Once | INTRAVENOUS | Status: AC
  Administered 2018-04-25: 5 mg via INTRAVENOUS
  Filled 2018-04-25: qty 5

## 2018-04-25 MED ORDER — ATORVASTATIN CALCIUM 40 MG PO TABS
40.0000 mg | ORAL_TABLET | Freq: Every day | ORAL | Status: DC
  Filled 2018-04-25 (×2): qty 1

## 2018-04-25 NOTE — ED Notes (Signed)
Pt returned from x-ray/CT at this time.

## 2018-04-25 NOTE — ED Triage Notes (Signed)
Pt presents for evaluation of hypertension and near syncope today while at gym. Pt denies cp/sob. Reports he feels back to baseline at this time except for a headache. States lightheadedness was when he bent over a bench to get something out of his bag.

## 2018-04-25 NOTE — ED Provider Notes (Signed)
Medical screening examination/treatment/procedure(s) were conducted as a shared visit with non-physician practitioner(s) and myself.  I personally evaluated the patient during the encounter.  EKG Interpretation  Date/Time:  Tuesday April 25 2018 11:07:42 EST Ventricular Rate:  51 PR Interval:  138 QRS Duration: 94 QT Interval:  416 QTC Calculation: 383 R Axis:   -23 Text Interpretation:  Sinus bradycardia Otherwise normal ECG Confirmed by Vanetta Mulders [phone]) on 04/25/2018 11:49:47 AM  Patient seen by me along with physician assistant.  Patient with a near syncopal episode while at the gym and noted that his blood pressures were very high.  They have remained high.  Up around 200 systolic despite even dose of Lopressor IV which brought it down to about 168.  Not able to explain why the pressures are so high.  Associated with intermittent headache.  Head CT negative labs without significant abnormality.  Patient's orthostatic pressures were find that this were on the high side.  Recommend admission patient discussed with family medicine they will see patient for admission.  Patient not a hypertensive emergency but somewhat of a hypertensive urgency needs better blood pressure control.  Patient here is nontoxic exam without any significant findings.   Vanetta Mulders, MD 04/25/18 [phone]
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
## 2018-04-25 NOTE — Telephone Encounter (Signed)
Patient came in for an appointment today but there was no [facility] authorization for that appointment.  He has been r/s to 3/3.  Please obtain the VA authorization for that appointment.  Patient asked that if there was a problem with the [facility] to give him a call so that he does come for his appointment.  [id number]#[phone].  Thank you.

## 2018-04-25 NOTE — ED Notes (Signed)
Patient transported to X-ray 

## 2018-04-25 NOTE — ED Notes (Signed)
RN informed ok for Pt to receive visitor 

## 2018-04-25 NOTE — ED Notes (Addendum)
RN informed it's ok for Pt to see visitor

## 2018-04-25 NOTE — Progress Notes (Signed)
I saw and examined Sean Adams.  I discussed with the full resident team and we agree on a management plan.  I will cosign the H&PE with available.  Briefly, 68 yo male with longstanding HBP presents with headache, blurred vision and markedly elevated BPs.  He only has rare headaches, no hx of migraine.  Headache and visual blurring has resolved since arrival to ER.  Also noted to be bradycardic at baseline which worsened with Beta blocker.    Also on statin. Primary prevention.  No hx of stroke, MI or PVD.  Did have bout of CP weeks ago.  Seen by PCP - BP also up then.  Stress test was done at [facility].  He does not know results. No pain or SOB at this point.  Does admit to dietary indiscretion with potato chips.  Issues: 1. Given HA and blurred vision, Dx of hypertensive urgency is reasonable.  Seems to have longstanding, poorly controled HBP.  Likely needs more meds.  Avoid Beta blocker due to bradycardia.  Amlodipine would be good choice - already on an ARB and thiazide 2. At risk for CAD.  Check lipids and get results of stress test.  Likely should be on daily ASA.  Given quick resolution of sx, anticipate overnight stay - so admit for obs status.
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
## 2018-04-26 LAB — BASIC METABOLIC PANEL
Anion gap: 6 (ref 5–15)
BUN: 11 mg/dL (ref 8–23)
CHLORIDE: 108 mmol/L (ref 98–111)
CO2: 25 mmol/L (ref 22–32)
Calcium: 9 mg/dL (ref 8.9–10.3)
Creatinine, Ser: 1.02 mg/dL (ref 0.61–1.24)
GFR calc Af Amer: >60 mL/min (ref >60)
GFR calc non Af Amer: >60 mL/min (ref >60)
Glucose, Bld: 96 mg/dL (ref 70–99)
Potassium: 4 mmol/L (ref 3.5–5.1)
Sodium: 139 mmol/L (ref 135–145)

## 2018-04-26 LAB — URINALYSIS, ROUTINE W REFLEX MICROSCOPIC
Bilirubin Urine: NEGATIVE
Glucose, UA: NEGATIVE mg/dL
Hgb urine dipstick: NEGATIVE
Ketones, ur: NEGATIVE mg/dL
LEUKOCYTE UA: NEGATIVE
Nitrite: NEGATIVE
PH: 5 (ref 5.0–8.0)
Protein, ur: NEGATIVE mg/dL
Specific Gravity, Urine: 1.018 (ref 1.005–1.030)

## 2018-04-26 LAB — HIV ANTIBODY (ROUTINE TESTING W REFLEX): HIV Screen 4th Generation wRfx: NONREACTIVE

## 2018-04-26 MED ORDER — LOSARTAN POTASSIUM 100 MG PO TABS: 100.0000 mg | ORAL_TABLET | Freq: Every day | ORAL | 0 refills | Status: AC

## 2018-04-26 MED ORDER — AMLODIPINE BESYLATE 10 MG PO TABS: 10.0000 mg | ORAL_TABLET | Freq: Every day | ORAL | 0 refills | Status: AC

## 2018-04-26 MED ORDER — ASPIRIN 81 MG PO TBEC: 81.0000 mg | DELAYED_RELEASE_TABLET | Freq: Every day | ORAL | 0 refills | Status: AC

## 2018-04-26 MED ORDER — AMLODIPINE BESYLATE 10 MG PO TABS
10.0000 mg | ORAL_TABLET | Freq: Every day | ORAL | Status: DC
  Administered 2018-04-26: 10 mg via ORAL
  Filled 2018-04-26: qty 1

## 2018-04-26 NOTE — Discharge Summary (Addendum)
Family Medicine Teaching [facility] Discharge Summary  Patient name: Sean Adams Medical record number: [id number] Date of birth: 17-Mar-1950 Age: 68 y.o. Gender: male Date of Admission: 04/25/2018  Date of Discharge: 04/26/2018 Admitting Physician: Carney Living, MD  Primary Care Provider: Sherwood Gambler, MD Consultants: None  Indication for Hospitalization: Lightheadedness in the setting of uncontrolled HTN  Discharge Diagnoses/Problem List:  Lightheadedness in the setting ofUncontrolled Hypertension Sinus bradycardia Urinary urgency Hyperlipidemia GERD Allergic rhinitis Osteoarthritis of knees bilaterally, s/p knee replacement  Disposition: Home  Discharge Condition: Back to baseline health, mild headache that improves with tylenol  Discharge Exam:  General: well-appearing, NAD Cardiovascular: RRR, no m/r/g Respiratory: CTAB, normal WOB Abdomen: soft, nontender Extremities: no peripheral edema  Brief Hospital Course:  Sean Adams is a 68 yo man admitted for blurry vision, lightheadedness, and headache in the setting of uncontrolled hypertension. He presented with the above symptoms and BP 211/87 with HR 52. EKG was notable for sinus bradycardia without heart block. I-STAT troponin was negative. CXR notable for cardiomegaly and L lung infiltrate. CT head notable for chronic R cerebellar infarct stable from 2003. Patient's symptoms improved with blood pressure control. He was started on amlodipine, losartan was increased to 100 mg daily and home chlorthalidone was continued. He was monitored on telemetry without event. On day of discharge his BP decreased to 151/79 and patient was able to ambulate without difficulty.  Issues for Follow Up:  1. Consider referral to Ophthalmology to further assess blurry vision. 2. Titrate blood pressure medications. Consider ambulatory blood pressure monitoring. Would add spironolactone if additional agent is  needed. 3. Consider echocardiogram to further evaluate for HTN, cardiomegaly. 4. Consider PSA, digital rectal exam, to further evaluate incomplete bladder emptying.  Significant Procedures: None  Significant Labs and Imaging:  Recent Labs  Lab 04/25/18 1109 04/25/18 1628  WBC 3.8* 3.9*  HGB 15.1 14.7  HCT 47.8 45.5  PLT 182 167   Recent Labs  Lab 04/25/18 1109 04/25/18 1628 04/26/18 0259  NA 138  --  139  K 4.3  --  4.0  CL 107  --  108  CO2 21*  --  25  GLUCOSE 121*  --  96  BUN 11  --  11  CREATININE 0.96 1.02 1.02  CALCIUM 8.8*  --  9.0      Results/Tests Pending at Time of Discharge: None  Discharge Medications:  Allergies as of 04/26/2018   No Known Allergies     Medication List    STOP taking these medications   naproxen 500 MG tablet Commonly known as:  NAPROSYN     TAKE these medications   amLODipine 10 MG tablet Commonly known as:  NORVASC Take 1 tablet (10 mg total) by mouth daily. Start taking on:  April 27, 2018   aspirin 81 MG EC tablet Take 1 tablet (81 mg total) by mouth daily. Start taking on:  April 27, 2018   atorvastatin 40 MG tablet Commonly known as:  LIPITOR Take 40 mg by mouth daily.   chlorthalidone 25 MG tablet Commonly known as:  HYGROTON Take 25 mg by mouth daily.   famotidine 20 MG tablet Commonly known as:  PEPCID Take 20 mg by mouth 2 (two) times daily.   loratadine 10 MG tablet Commonly known as:  CLARITIN Take 10 mg by mouth daily as needed for allergies.   losartan 100 MG tablet Commonly known as:  COZAAR Take 1 tablet (100 mg total) by mouth daily.  Start taking on:  April 27, 2018 What changed:    medication strength  how much to take   nitroGLYCERIN 0.4 MG SL tablet Commonly known as:  NITROSTAT Place 0.4 mg under the tongue every 5 (five) minutes as needed for chest pain.   oxyCODONE 5 MG immediate release tablet Commonly known as:  Oxy IR/ROXICODONE Take 1-3 tablets (5-15 mg total) by  mouth every 4 (four) hours as needed.   polyethylene glycol packet Commonly known as:  MIRALAX / GLYCOLAX Take 17 g by mouth daily as needed for mild constipation.       Discharge Instructions: Please refer to Patient Instructions section of EMR for full details.  Patient was counseled important signs and symptoms that should prompt return to medical care, changes in medications, dietary instructions, activity restrictions, and follow up appointments.   Follow-Up Appointments: Follow-up Information    Borum, Vista Mink, MD.   Specialty:  Internal Medicine Contact information: [phone] [facility] [place] [address] [phone]           I have seen and evaluated the patient with Medical Student Minish. I am in agreement with the note above in its revised form.   Lovena Neighbours, MD Family Medicine, PGY-3   Cathleen Corti, Medical Student 04/26/2018, 2:37 PM [facility]

## 2018-04-26 NOTE — Care Management Note (Addendum)
Case Management Note  Patient Details  Name: Sean Adams MRN: [id number] Date of Birth: June 11, 1950  Subjective/Objective:  From home, pta indep, uncntrolled HTN, ambulatory, Veteran at [place], PCP [employer] [phone].  NCM will fax dc summary to PCP at [facility] at dc. [facility] and [facility] today. [facility].  DME- BP monitor at home. PCP - Leonie Douglas at [facility] [place] [facility] and local pharmacy is [facility] on [address] Medications- no problem getting. Transportation- he has transportation.                  Action/Plan: DC home when ready.   Expected Discharge Date:                  Expected Discharge Plan:  Home/Self Care  In-House Referral:     Discharge planning Services  CM Consult  Post Acute Care Choice:    Choice offered to:     DME Arranged:    DME Agency:     HH Arranged:    HH Agency:     Status of Service:  Completed, signed off  If discussed at [employer] of Stay Meetings, dates discussed:    Additional Comments:  Leone Haven, RN 04/26/2018, 12:12 PM

## 2018-04-26 NOTE — Progress Notes (Signed)
Pt ambulated in hallway and around unit with no assistance. He tolerated activity very well. No s/s of activity intolerance.

## 2018-04-26 NOTE — Progress Notes (Deleted)
Family Medicine Teaching Service Daily Progress Note Intern Pager: [phone]  Patient name: Sean Adams Medical record number: [id number] Date of birth: Dec 03, 1950 Age: 68 y.o. Gender: male  Primary Care Provider: Sherwood Gambler, MD Consultants: None Code Status: Full code  Pt Overview and Major Events to Date:  Sean Adams is a 68 y.o. male presenting with uncontrolled hypertension . PMH is significant for HTN, osteoarthritis, CVA in 2003.   Assessment and Plan:  #Lightheadedness in the setting of Uncontrolled Hypertension Patient's presentation of acute onset blurry vision, dizziness, lightheadedness and HA likely due to uncontrolled HTN. BP 211/87 with HR 52 on admission, improved with IV metoprolol. EKG showing sinus bradycardia but no sign of ischemia or heart block. ISTAT troponin 0.01. CXR showing cardiomegaly and mild left lung infiltrate. CT head wo contrast showing chronic right cerebellar infarct stable from 2003 but no acute abnormality. Symptoms of dizziness, HA, and blurry vision have improved with as blood pressure has correction, although still has residual HA. Overall, no focal neurologic and visual fields are grossly intact. A1C and lipid panel were unremarkable.  - s/p metoprolol tartrate 5 mg in ED, would avoid BB given bradycardia - Continue losartan 100 mg daily - Continue amlodipine 10 mg - Continue chlorthalidone 25 mg - Hydralazine 5 mg IV PRN for SBP >= 180 DBP >= 120 - F/U orthostatic vital signs - Continuous cardiac monitoring - I/Os  #Sinus bradycardia Sinus bradycardia without heart block on EKG. Also seen on prior EKG in 04/2017. He is not on a beta blocker at home. This could be contributing to symptoms of lightheadedness. Treatment of bradycardia not indicated since patient is asymptomatic now with improved BP control. Potential causes of bradycardia that could be further evaluated include infection, hypothyroidism, or other  bradyarrhythmia. - Continue to monitor - Avoid beta blockers  #Urinary urgency Pt feels that he cannot empty his bladder completely and has associated urgency. No dribbling or dysuria, or fever, making UTI less likely. Possibly BPH given age. Reports he had PSA done 5-6 months ago but does not know the results. UA unremarkable. - monitor I/Os - low threshold for bladder scan if patient has low UOP  - consider DRE or PSA as outpatient   Chronic, but stable medical conditions  #HLD Lipid panel unremarkable. - Continue home atorvastatin 40 mg  #GERD - Continue home famotidine 20 mg  #Allergic rhinitis - Continue home loratidine 10 mg  #Osteoarthris of knees bilaterally, s/p knee replacement Pt take naproxen as needed for knee pain.  - hold naproxen due to uncontrolled HTN - Tylenol and Voltaren gel prn pain  FEN/GI: Heart healthy diet PPx: Lovenox  Disposition: Admitted to telemetry, likely discharge home today  Subjective:  Patient feels back to his normal self today. He endorses some HA this morning that improved with tylenol. He denies dizziness, lightheadedness, blurry vision, SOB or CP.  He believes he had a PSA done as an outpatient 5-6 months ago for urinary symptoms.  Objective: Temp:  [97.7 F (36.5 C)-98 F (36.7 C)] 97.8 F (36.6 C) (02/26 0744) Pulse Rate:  [43-54] 54 (02/26 0744) Resp:  [10-20] 17 (02/25 2250) BP: (151-211)/(79-115) 151/79 (02/26 0744) SpO2:  [96 %-100 %] 96 % (02/26 0744) Weight:  [106.1 kg] 106.1 kg (02/25 1622) Physical Exam: General: well-appearing, NAD Cardiovascular: RRR, no m/r/g Respiratory: CTAB, normal WOB Abdomen: soft, nontender Extremities: no peripheral edema  Laboratory: Recent Labs  Lab 04/25/18 1109 04/25/18 1628  WBC 3.8* 3.9*  HGB  15.1 14.7  HCT 47.8 45.5  PLT 182 167   Recent Labs  Lab 04/25/18 1109 04/25/18 1628 04/26/18 0259  NA 138  --  139  K 4.3  --  4.0  CL 107  --  108  CO2 21*  --  25   BUN 11  --  11  CREATININE 0.96 1.02 1.02  CALCIUM 8.8*  --  9.0  GLUCOSE 121*  --  96      Imaging/Diagnostic Tests: Dg Chest 2 View  Result Date: 04/25/2018 CLINICAL DATA:  Hypertension.  Near syncope. EXAM: CHEST - 2 VIEW COMPARISON:  04/27/2017. FINDINGS: Mild mediastinal prominence, most likely related to technique and prominent great vessels. Cardiomegaly. No pulmonary venous congestion. Low lung volumes. Mild infiltrate left mid lung and left lung base can not be excluded. No pleural effusion pneumothorax. IMPRESSION: 1.  Cardiomegaly.  No pulmonary venous congestion. 2. Low lung volumes. Mild left mid lung and left base infiltrate can not be excluded. Electronically Signed   By: [name]  Register   On: 04/25/2018 12:40   Ct Head Wo Contrast  Result Date: 04/25/2018 CLINICAL DATA:  Headaches EXAM: CT HEAD WITHOUT CONTRAST TECHNIQUE: Contiguous axial images were obtained from the base of the skull through the vertex without intravenous contrast. COMPARISON:  None. FINDINGS: Brain: There are changes consistent with prior infarct in the right cerebellum stable from a prior MRI dated 06/20/2001. No findings to suggest acute hemorrhage, acute infarction or space-occupying mass lesion are noted. Vascular: No hyperdense vessel or unexpected calcification. Skull: Normal. Negative for fracture or focal lesion. Sinuses/Orbits: Minimal mucosal thickening is noted within the paranasal sinuses. Other: None IMPRESSION: Chronic right cerebellar infarct stable from 2003. No acute abnormality noted. Electronically Signed   By: Alcide Clever M.D.   On: 04/25/2018 12:54    Cathleen Corti, Medical Student 04/26/2018, 8:43 AM [facility] FPTS Intern pager: [phone], text pages welcome

## 2018-05-02 ENCOUNTER — Ambulatory Visit (INDEPENDENT_AMBULATORY_CARE_PROVIDER_SITE_OTHER): Payer: PRIVATE HEALTH INSURANCE | Admitting: Orthopaedic Surgery

## 2018-05-10 NOTE — Telephone Encounter (Signed)
Submit for [employer].

## 2018-05-16 ENCOUNTER — Other Ambulatory Visit (HOSPITAL_COMMUNITY): Payer: Self-pay | Admitting: Family Medicine

## 2018-05-25 NOTE — Telephone Encounter (Signed)
Xu patient, needs [facility] auth, I had not submitted this one yet.  See me for the paperwork?  Thanks.

## 2018-05-25 NOTE — Telephone Encounter (Signed)
Done. Still waiting on approval.

## 2021-01-04 IMAGING — DX DG CHEST 2V
2 series · 2 of 2 positions shown · non-contrast
Comparison: 04/27/2017.

CLINICAL DATA: Hypertension.  Near syncope.

EXAM:
CHEST - 2 VIEW

[chest lat]
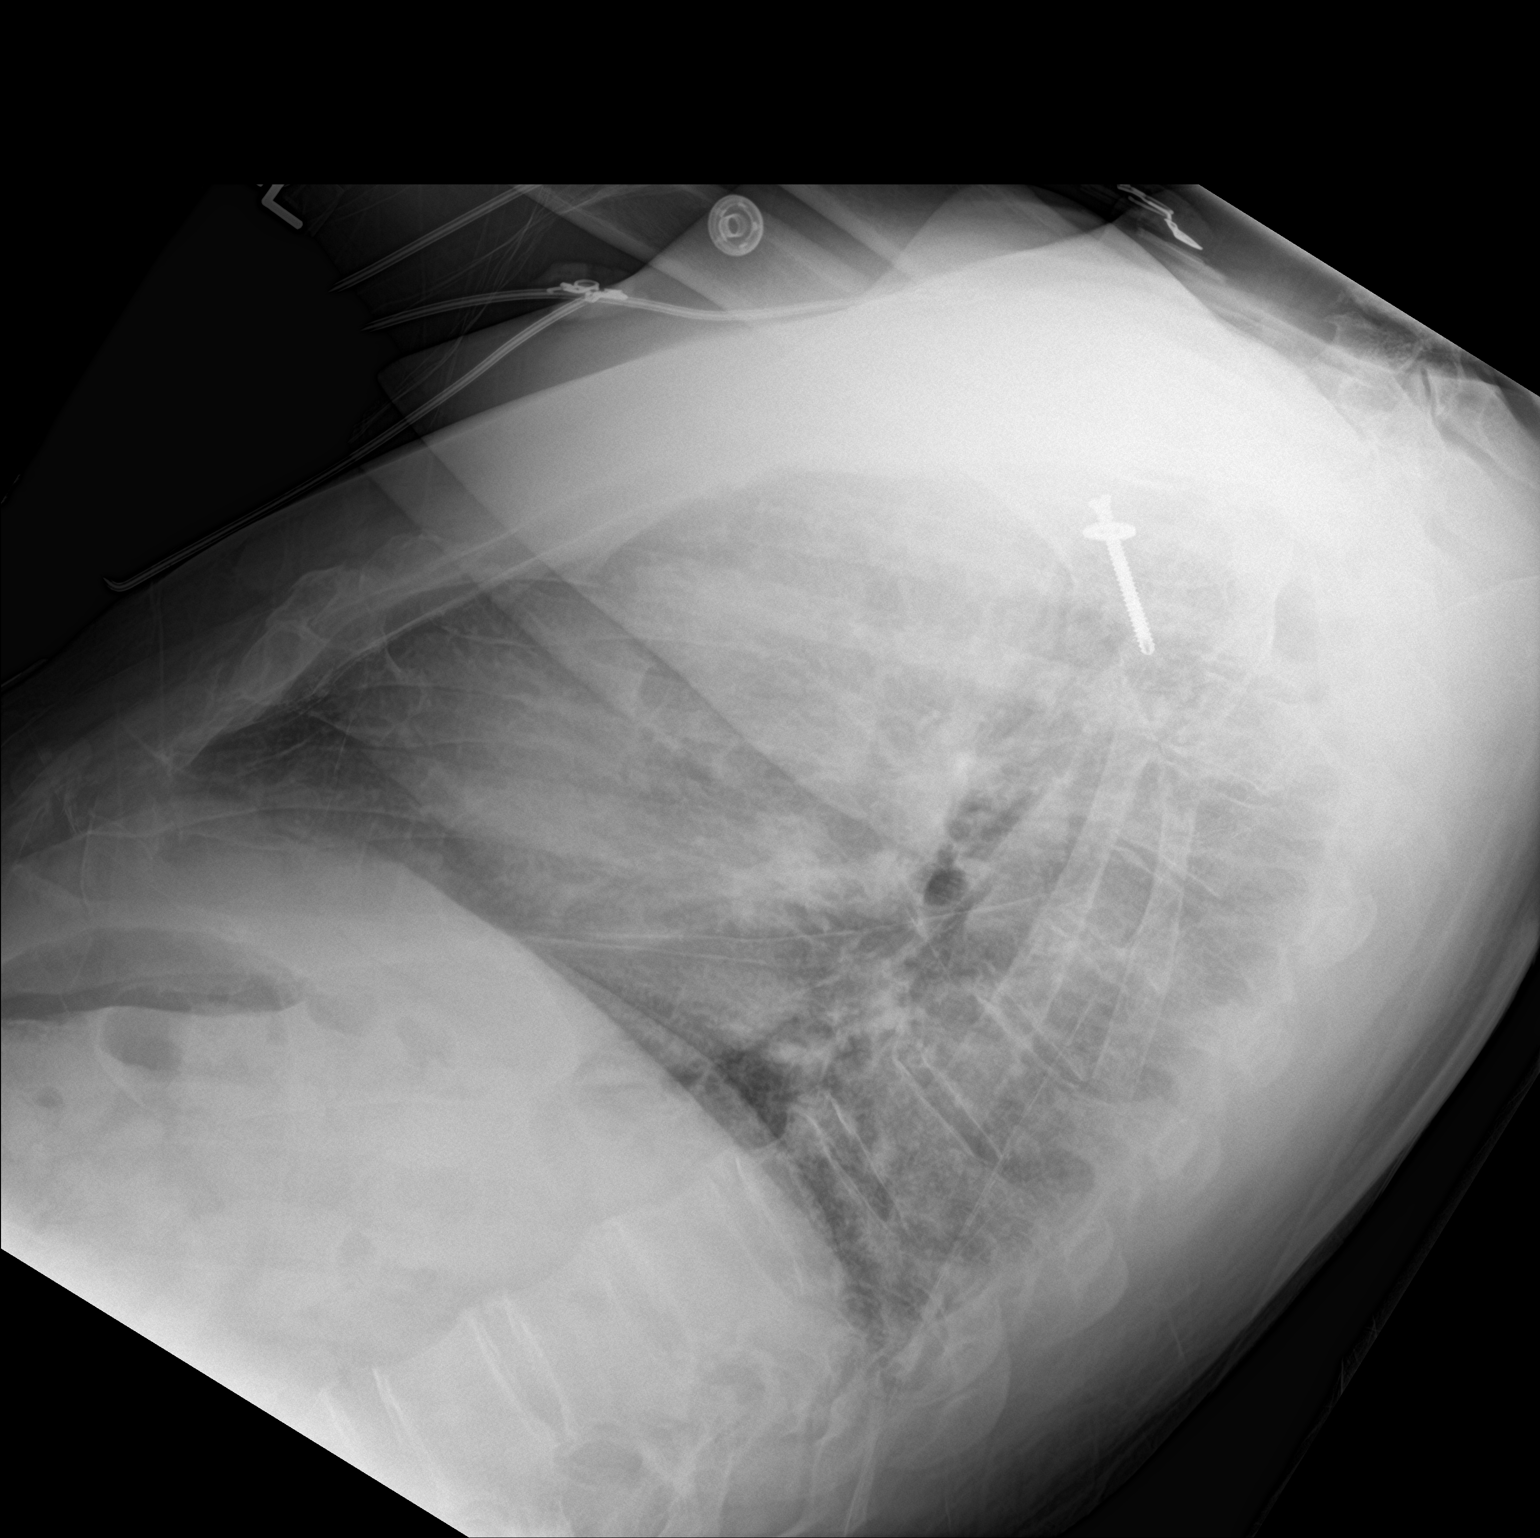

[chest ap]
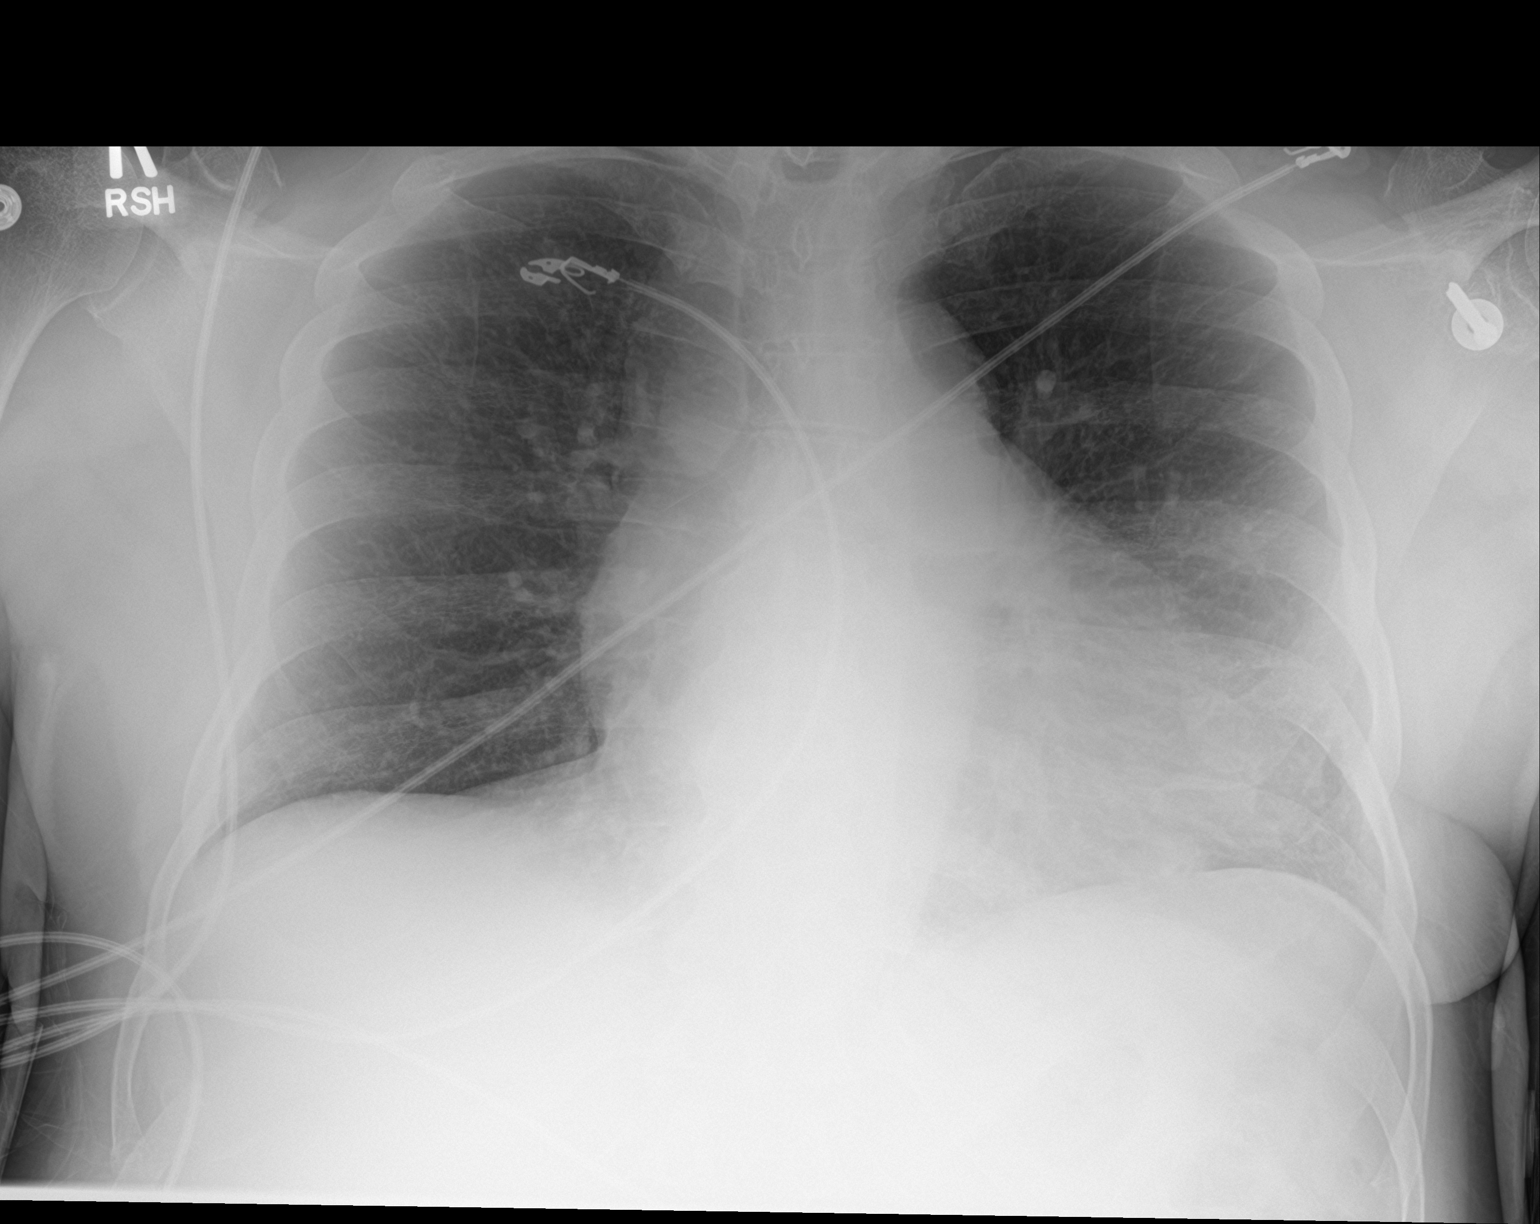

[2 of 2 positions shown; findings below may reference images not displayed]

FINDINGS: Mild mediastinal prominence, most likely related to technique and
prominent great vessels. Cardiomegaly. No pulmonary venous
congestion. Low lung volumes. Mild infiltrate left mid lung and left
lung base can not be excluded. No pleural effusion pneumothorax.
IMPRESSION: 1.  Cardiomegaly.  No pulmonary venous congestion.

2. Low lung volumes. Mild left mid lung and left base infiltrate can
not be excluded.

## 2021-07-03 ENCOUNTER — Telehealth: Payer: Self-pay | Admitting: Gastroenterology

## 2021-07-03 NOTE — Telephone Encounter (Signed)
Good Morning Dr. Christella Hartigan, ? ?D.O.D for 5/3 AM ? ?Onalee Hua from Dept of [facility] called wanting to schedule patient for an office visit for dysphagia. After looking at patients chart, patient was last seen by [facility] in October of 2022. Patients records are in Epic, will you please review and advise on scheduling? ? ?Thank you.  ?

## 2021-07-08 NOTE — Telephone Encounter (Signed)
Pt came to the office this morning inquiring about this request and was given Dr. Christella Hartigan message. I gave pt information about [facility].  ?

## 2021-07-15 ENCOUNTER — Ambulatory Visit (INDEPENDENT_AMBULATORY_CARE_PROVIDER_SITE_OTHER): Payer: No Typology Code available for payment source | Admitting: Orthopaedic Surgery

## 2021-07-15 DIAGNOSIS — M75111 Incomplete rotator cuff tear or rupture of right shoulder, not specified as traumatic: Secondary | ICD-10-CM

## 2021-07-15 DIAGNOSIS — S43431A Superior glenoid labrum lesion of right shoulder, initial encounter: Secondary | ICD-10-CM | POA: Diagnosis not present

## 2021-07-15 DIAGNOSIS — M19011 Primary osteoarthritis, right shoulder: Secondary | ICD-10-CM | POA: Diagnosis not present

## 2021-07-15 DIAGNOSIS — M7541 Impingement syndrome of right shoulder: Secondary | ICD-10-CM | POA: Diagnosis not present
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
## 2021-07-22 ENCOUNTER — Other Ambulatory Visit: Payer: Self-pay | Admitting: Physician Assistant

## 2021-07-22 MED ORDER — ONDANSETRON HCL 4 MG PO TABS: 4.0000 mg | ORAL_TABLET | Freq: Three times a day (TID) | ORAL | 0 refills | Status: AC | PRN

## 2021-07-22 MED ORDER — HYDROCODONE-ACETAMINOPHEN 5-325 MG PO TABS
1.0000 | ORAL_TABLET | Freq: Three times a day (TID) | ORAL | 0 refills | Status: AC | PRN
Start: 2021-07-22 — End: ?

## 2021-07-23 ENCOUNTER — Encounter: Payer: Self-pay | Admitting: Orthopaedic Surgery

## 2021-07-23 DIAGNOSIS — M24111 Other articular cartilage disorders, right shoulder: Secondary | ICD-10-CM | POA: Diagnosis not present

## 2021-07-23 DIAGNOSIS — M19011 Primary osteoarthritis, right shoulder: Secondary | ICD-10-CM | POA: Diagnosis not present

## 2021-07-23 DIAGNOSIS — M7541 Impingement syndrome of right shoulder: Secondary | ICD-10-CM | POA: Diagnosis not present

## 2021-07-23 DIAGNOSIS — M75111 Incomplete rotator cuff tear or rupture of right shoulder, not specified as traumatic: Secondary | ICD-10-CM | POA: Diagnosis not present

## 2021-07-24 ENCOUNTER — Telehealth: Payer: Self-pay | Admitting: Orthopaedic Surgery

## 2021-07-24 NOTE — Telephone Encounter (Signed)
Patient returned call to let Dr. Roda Shutters know he is doing fine.     [phone]

## 2021-07-25 NOTE — Telephone Encounter (Signed)
Great to hear!

## 2021-07-31 ENCOUNTER — Ambulatory Visit (INDEPENDENT_AMBULATORY_CARE_PROVIDER_SITE_OTHER): Payer: No Typology Code available for payment source | Admitting: Physician Assistant

## 2021-07-31 ENCOUNTER — Encounter: Payer: Self-pay | Admitting: Orthopaedic Surgery

## 2021-07-31 DIAGNOSIS — Z9889 Other specified postprocedural states: Secondary | ICD-10-CM
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
## 2021-08-04 ENCOUNTER — Other Ambulatory Visit: Payer: Self-pay

## 2021-08-04 ENCOUNTER — Encounter: Payer: Self-pay | Admitting: Rehabilitative and Restorative Service Providers"

## 2021-08-04 ENCOUNTER — Ambulatory Visit (INDEPENDENT_AMBULATORY_CARE_PROVIDER_SITE_OTHER): Payer: No Typology Code available for payment source | Admitting: Rehabilitative and Restorative Service Providers"

## 2021-08-04 DIAGNOSIS — M25511 Pain in right shoulder: Secondary | ICD-10-CM | POA: Diagnosis not present

## 2021-08-04 DIAGNOSIS — M25611 Stiffness of right shoulder, not elsewhere classified: Secondary | ICD-10-CM | POA: Diagnosis not present

## 2021-08-04 DIAGNOSIS — R600 Localized edema: Secondary | ICD-10-CM

## 2021-08-04 DIAGNOSIS — M6281 Muscle weakness (generalized): Secondary | ICD-10-CM | POA: Diagnosis not present

## 2021-08-04 DIAGNOSIS — G8929 Other chronic pain: Secondary | ICD-10-CM
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
## 2021-08-12 ENCOUNTER — Ambulatory Visit (INDEPENDENT_AMBULATORY_CARE_PROVIDER_SITE_OTHER): Payer: No Typology Code available for payment source | Admitting: Physical Therapy

## 2021-08-12 ENCOUNTER — Encounter: Payer: Self-pay | Admitting: Physical Therapy

## 2021-08-12 DIAGNOSIS — M6281 Muscle weakness (generalized): Secondary | ICD-10-CM | POA: Diagnosis not present

## 2021-08-12 DIAGNOSIS — M25511 Pain in right shoulder: Secondary | ICD-10-CM

## 2021-08-12 DIAGNOSIS — G8929 Other chronic pain: Secondary | ICD-10-CM

## 2021-08-12 DIAGNOSIS — R600 Localized edema: Secondary | ICD-10-CM

## 2021-08-12 DIAGNOSIS — M25611 Stiffness of right shoulder, not elsewhere classified: Secondary | ICD-10-CM
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
## 2021-08-14 ENCOUNTER — Encounter: Payer: Self-pay | Admitting: Physical Therapy

## 2021-08-14 ENCOUNTER — Ambulatory Visit (INDEPENDENT_AMBULATORY_CARE_PROVIDER_SITE_OTHER): Payer: No Typology Code available for payment source | Admitting: Physical Therapy

## 2021-08-14 DIAGNOSIS — M25611 Stiffness of right shoulder, not elsewhere classified: Secondary | ICD-10-CM | POA: Diagnosis not present

## 2021-08-14 DIAGNOSIS — M25511 Pain in right shoulder: Secondary | ICD-10-CM | POA: Diagnosis not present

## 2021-08-14 DIAGNOSIS — G8929 Other chronic pain: Secondary | ICD-10-CM

## 2021-08-14 DIAGNOSIS — M6281 Muscle weakness (generalized): Secondary | ICD-10-CM

## 2021-08-14 DIAGNOSIS — R600 Localized edema: Secondary | ICD-10-CM
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
## 2021-08-17 ENCOUNTER — Ambulatory Visit (INDEPENDENT_AMBULATORY_CARE_PROVIDER_SITE_OTHER): Payer: No Typology Code available for payment source | Admitting: Rehabilitative and Restorative Service Providers"

## 2021-08-17 ENCOUNTER — Encounter: Payer: Self-pay | Admitting: Rehabilitative and Restorative Service Providers"

## 2021-08-17 DIAGNOSIS — M6281 Muscle weakness (generalized): Secondary | ICD-10-CM | POA: Diagnosis not present

## 2021-08-17 DIAGNOSIS — R600 Localized edema: Secondary | ICD-10-CM | POA: Diagnosis not present

## 2021-08-17 DIAGNOSIS — M25611 Stiffness of right shoulder, not elsewhere classified: Secondary | ICD-10-CM | POA: Diagnosis not present

## 2021-08-17 DIAGNOSIS — M25511 Pain in right shoulder: Secondary | ICD-10-CM

## 2021-08-17 DIAGNOSIS — G8929 Other chronic pain: Secondary | ICD-10-CM
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
## 2021-08-21 ENCOUNTER — Ambulatory Visit (INDEPENDENT_AMBULATORY_CARE_PROVIDER_SITE_OTHER): Payer: No Typology Code available for payment source | Admitting: Rehabilitative and Restorative Service Providers"

## 2021-08-21 ENCOUNTER — Encounter: Payer: Self-pay | Admitting: Rehabilitative and Restorative Service Providers"

## 2021-08-21 DIAGNOSIS — R600 Localized edema: Secondary | ICD-10-CM

## 2021-08-21 DIAGNOSIS — M25611 Stiffness of right shoulder, not elsewhere classified: Secondary | ICD-10-CM | POA: Diagnosis not present

## 2021-08-21 DIAGNOSIS — G8929 Other chronic pain: Secondary | ICD-10-CM

## 2021-08-21 DIAGNOSIS — M25511 Pain in right shoulder: Secondary | ICD-10-CM | POA: Diagnosis not present

## 2021-08-21 DIAGNOSIS — M6281 Muscle weakness (generalized): Secondary | ICD-10-CM

## 2021-08-26 ENCOUNTER — Ambulatory Visit (INDEPENDENT_AMBULATORY_CARE_PROVIDER_SITE_OTHER): Payer: No Typology Code available for payment source | Admitting: Rehabilitative and Restorative Service Providers"

## 2021-08-26 ENCOUNTER — Encounter: Payer: Self-pay | Admitting: Rehabilitative and Restorative Service Providers"

## 2021-08-26 DIAGNOSIS — M25611 Stiffness of right shoulder, not elsewhere classified: Secondary | ICD-10-CM

## 2021-08-26 DIAGNOSIS — M25511 Pain in right shoulder: Secondary | ICD-10-CM | POA: Diagnosis not present

## 2021-08-26 DIAGNOSIS — G8929 Other chronic pain: Secondary | ICD-10-CM

## 2021-08-26 DIAGNOSIS — R600 Localized edema: Secondary | ICD-10-CM | POA: Diagnosis not present

## 2021-08-26 DIAGNOSIS — M6281 Muscle weakness (generalized): Secondary | ICD-10-CM | POA: Diagnosis not present
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
## 2021-08-27 ENCOUNTER — Encounter: Payer: No Typology Code available for payment source | Admitting: Physical Therapy

## 2021-08-27 ENCOUNTER — Ambulatory Visit (INDEPENDENT_AMBULATORY_CARE_PROVIDER_SITE_OTHER): Payer: No Typology Code available for payment source | Admitting: Physical Therapy

## 2021-08-27 ENCOUNTER — Encounter: Payer: Self-pay | Admitting: Physical Therapy

## 2021-08-27 DIAGNOSIS — M25611 Stiffness of right shoulder, not elsewhere classified: Secondary | ICD-10-CM

## 2021-08-27 DIAGNOSIS — R600 Localized edema: Secondary | ICD-10-CM | POA: Diagnosis not present

## 2021-08-27 DIAGNOSIS — G8929 Other chronic pain: Secondary | ICD-10-CM

## 2021-08-27 DIAGNOSIS — M25511 Pain in right shoulder: Secondary | ICD-10-CM | POA: Diagnosis not present

## 2021-08-27 DIAGNOSIS — M6281 Muscle weakness (generalized): Secondary | ICD-10-CM | POA: Diagnosis not present
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
## 2021-09-04 ENCOUNTER — Ambulatory Visit (INDEPENDENT_AMBULATORY_CARE_PROVIDER_SITE_OTHER): Payer: No Typology Code available for payment source | Admitting: Physician Assistant

## 2021-09-04 ENCOUNTER — Encounter: Payer: No Typology Code available for payment source | Admitting: Rehabilitative and Restorative Service Providers"

## 2021-09-04 ENCOUNTER — Encounter: Payer: Self-pay | Admitting: Orthopaedic Surgery

## 2021-09-04 DIAGNOSIS — Z9889 Other specified postprocedural states: Secondary | ICD-10-CM
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
## 2021-09-09 ENCOUNTER — Encounter: Payer: No Typology Code available for payment source | Admitting: Rehabilitative and Restorative Service Providers"

## 2021-11-06 ENCOUNTER — Encounter: Payer: Self-pay | Admitting: Dietician

## 2021-11-06 ENCOUNTER — Encounter: Payer: No Typology Code available for payment source | Attending: Internal Medicine | Admitting: Dietician

## 2021-11-06 DIAGNOSIS — Z6830 Body mass index (BMI) 30.0-30.9, adult: Secondary | ICD-10-CM | POA: Insufficient documentation

## 2021-11-06 DIAGNOSIS — E669 Obesity, unspecified: Secondary | ICD-10-CM | POA: Insufficient documentation

## 2021-11-06 NOTE — Progress Notes (Signed)
Medical Nutrition Therapy  Appointment Start time:  0800  Appointment End time:  [phone]  Primary concerns today: Pt feels like he doesn't know how to eat well. He states he feels that he is gaining some weight in his abdominal area and wants to lose some of it but feels that he doesn't know how to eat healthy.  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 Medications: reviewed Labs: reviewed Notable Signs/Symptoms: low energy/sluggish  Lifestyle & Dietary Hx Pt lives with wife. His wife does most of the shopping and cooking.   Pt states that he tends to eat only 2 meals per day. He has very low fruit and vegetable intake.   Pt reports drinking sweet tea daily. He states that his wife cut the sugar back from 1 1/2 cups to 1 cup in the gallon, and that he drinks 1 glass with dinner and 2 after dinner. Pt drinks 32 oz water daily.   Estimated daily fluid intake: 32 oz Supplements: none Sleep: 4-5 hours. Pt states he feels that he doesn't sleep enough because he tends to stay up late watching TV instead of going to bed.  Stress / self-care: low stress Current average weekly physical activity: goes to gym, does bike, treadmill and some resistance training. Goes bowling 3-4 days a week.   24-Hr Dietary Recall First Meal: 10am- grits, sausage or bacon. Snack: none Second Meal: none Snack: none Third Meal: 5:30/6pm- chicken or beef with rice and occasional vegetables Snack: cake OR chips Beverages: 3 glasses sweet tea, 32 oz water   NUTRITION DIAGNOSIS  NB-1.1 Food and nutrition-related knowledge deficit As related to inadequate intake of fruits and vegetables.  As evidenced by pt report and diet history.   NUTRITION INTERVENTION  Nutrition education (E-1) on the following topics:  Saturated vs. Unsaturated  fat Fat soluble vs. Water soluble vitamins Balance of carbohydrate, protein, and non-starchy vegetables Functions of fiber in the body Importance of hydration/fluid  MyPlate Lean vs. Fatty protein Importance of adequate sleep Physical activity goals  Handouts Provided Include  Dish Up a Healthy Meal Advance Directives Meal Plan Card  Learning Style & Readiness for Change Teaching method utilized: Visual & Auditory  Demonstrated degree of understanding via: Teach Back  Barriers to learning/adherence to lifestyle change: none  Goals Established by Pt Eat more Non-Starchy Vegetables. Make 1/2 of your plate non-starchy vegetables.   Make simple meals at home more often than eating out.  Aim to drink 4 16oz bottles of water daily. Tips for increasing water intake: -Keep a glass of water by your bedside so that you're encouraged to drink it upon waking -Carry a re-usable, re-fillable water bottle -Add fruit such as lemon, lime, berries, or cucumbers to your water -Try sparkling water  Try using half the sugar when making the sweet tea. OR Only have sweet tea with dinner, and stop after.   Tips to have your produce last longer Lettuce, strawberries, blueberries, etc. Rinse, dry very well, and store in an airtight container with a paper towel in it.  Since you washed, it is ready to eat!  Consider having your vitamin D checked.    MONITORING & EVALUATION Dietary intake, weekly physical activity, and follow up in 2 months.  Next Steps  Patient is to call for questions.

## 2021-11-06 NOTE — Patient Instructions (Addendum)
Eat more Non-Starchy Vegetables. Make 1/2 of your plate non-starchy vegetables.   Make simple meals at home more often than eating out.  Aim to drink 4 16oz bottles of water daily. Tips for increasing water intake: -Keep a glass of water by your bedside so that you're encouraged to drink it upon waking -Carry a re-usable, re-fillable water bottle -Add fruit such as lemon, lime, berries, or cucumbers to your water -Try sparkling water  Try using half the sugar when making the sweet tea. OR Only have sweet tea with dinner, and stop after.   Tips to have your produce last longer Lettuce, strawberries, blueberries, etc. Rinse, dry very well, and store in an airtight container with a paper towel in it.  Since you washed, it is ready to eat!  Consider having your vitamin D checked.

## 2022-01-11 ENCOUNTER — Encounter: Payer: No Typology Code available for payment source | Attending: Internal Medicine | Admitting: Dietician

## 2022-01-11 VITALS — Ht 74.0 in | Wt 241.6 lb

## 2022-01-11 DIAGNOSIS — Z6831 Body mass index (BMI) 31.0-31.9, adult: Secondary | ICD-10-CM | POA: Insufficient documentation

## 2022-01-11 DIAGNOSIS — E669 Obesity, unspecified: Secondary | ICD-10-CM | POA: Diagnosis present

## 2022-01-11 NOTE — Patient Instructions (Addendum)
Aim to drink 6-8 of your small water bottles daily.  [place]  Aim to include non-starchy vegetables with each meal.  When choosing canned fruit look for it in 100% fruit juice.

## 2022-01-11 NOTE — Progress Notes (Signed)
  Medical Nutrition Therapy  Appointment Start time:  [phone]  Appointment End time: 0945   Primary concerns today: Pt states he feels much healthier but he feels that at his age either most other people his age are working or not doing anything and he wants new ideas for activities and questions about balanced snacks.   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 Medications: reviewed Labs: reviewed Notable Signs/Symptoms: none   Lifestyle & Dietary Hx  Pt states he's been eating a lot more greens and he's been trying to make vegetables half his plate at dinner time.  Pt states his acid reflux has started going away.   Pt has started to go on walks after breakfast for a 2-3 miles at a park near his house. He states he still goes to the gym sometimes for upper body training.   Pt has gotten much more active since the last time we talked. Pt used to bowl 3-4 days per week, but now bowls 6-7 days per week.   Pt stopped eating chips after dinner. He states he still eats 2 meals per day but now has a snack in between which is usually peanuts.   Pt states he stopped drinking sweet tea for 2 weeks, but then he started drinking it again. He states they took some of the sugar out. They now make it with 1 cup of sugar per gallon and he has a glass with lunch and dinner.  Pt has not increased water intake.    Estimated daily fluid intake: 24 oz Supplements: none Sleep: 4-5 hours. Pt states he feels that he doesn't sleep enough because he tends to stay up late watching TV instead of going to bed.  Stress / self-care: low stress Current average weekly physical activity: walks 2-3 miles, goes to gym sometimes, treadmill and some resistance training. Goes bowling 6-7 days a week.    24-Hr Dietary Recall First Meal: 10am:  grits with sausage Snack: none Second Meal: peanuts Snack: none Third Meal: 5:30pm: rice or potatoes with collards with chicken Snack: none Beverages: 2 glasses sweet tea, 2-3 cups of water     NUTRITION DIAGNOSIS  NB-1.1 Food and nutrition-related knowledge deficit As related to inadequate intake of fruits and vegetables.  As evidenced by pt report and diet history.     NUTRITION INTERVENTION  Nutrition education (E-1) on the following topics:  Balance of carbohydrate, protein, and non-starchy vegetables Functions of fiber in the body Importance of hydration/fluid  MyPlate Importance of adequate sleep Physical activity goals   Handouts Provided Include  Dish Up a Healthy Meal   Learning Style & Readiness for Change Teaching method utilized: Visual & Auditory  Demonstrated degree of understanding via: Teach Back  Barriers to learning/adherence to lifestyle change: none   Goals Established by Pt Aim to drink 6-8 of your small water bottles daily.  [place].  Aim to include non-starchy vegetables with each meal.  When choosing canned fruit look for it in 100% fruit juice.  Choose eggs instead of sausage to have with your grits.    MONITORING & EVALUATION Dietary intake, weekly physical activity, and follow up in 3-4 months.   Next Steps  Patient is to call for questions.

## 2022-05-10 ENCOUNTER — Ambulatory Visit: Payer: No Typology Code available for payment source | Admitting: Dietician

## 2024-01-25 ENCOUNTER — Other Ambulatory Visit (HOSPITAL_COMMUNITY): Payer: Self-pay | Admitting: Orthopedic Surgery

## 2024-01-25 ENCOUNTER — Encounter (HOSPITAL_COMMUNITY): Payer: Self-pay | Admitting: Orthopedic Surgery

## 2024-01-25 DIAGNOSIS — M25512 Pain in left shoulder: Secondary | ICD-10-CM

## 2024-01-31 ENCOUNTER — Other Ambulatory Visit (HOSPITAL_COMMUNITY): Payer: Self-pay | Admitting: Orthopedic Surgery

## 2024-01-31 DIAGNOSIS — M25512 Pain in left shoulder: Secondary | ICD-10-CM

## 2024-02-01 ENCOUNTER — Inpatient Hospital Stay (HOSPITAL_COMMUNITY)
Admission: RE | Admit: 2024-02-01 | Discharge: 2024-02-01 | Attending: Orthopedic Surgery | Admitting: Orthopedic Surgery

## 2024-02-01 DIAGNOSIS — M25512 Pain in left shoulder: Secondary | ICD-10-CM

## 2024-02-01 MED ORDER — LIDOCAINE HCL (PF) 1 % IJ SOLN
5.0000 mL | Freq: Once | INTRAMUSCULAR | Status: AC
  Administered 2024-02-01: 5 mL via INTRADERMAL

## 2024-02-01 MED ORDER — METHYLPREDNISOLONE ACETATE 40 MG/ML INJ SUSP (RADIOLOG
40.0000 mg | Freq: Once | INTRAMUSCULAR | Status: AC
  Administered 2024-02-01: 40 mg via INTRA_ARTICULAR

## 2024-02-01 MED ORDER — METHYLPREDNISOLONE ACETATE 40 MG/ML IJ SUSP
INTRAMUSCULAR | Status: AC
  Filled 2024-02-01: qty 1

## 2024-02-01 MED ORDER — IOHEXOL 180 MG/ML  SOLN
10.0000 mL | Freq: Once | INTRAMUSCULAR | Status: AC | PRN
  Administered 2024-02-01: 10 mL via INTRA_ARTICULAR

## 2024-02-01 MED ORDER — BUPIVACAINE HCL (PF) 0.25 % IJ SOLN
4.0000 mL | Freq: Once | INTRAMUSCULAR | Status: AC
  Administered 2024-02-01: 4 mL via INTRA_ARTICULAR

## 2024-02-01 MED ORDER — BUPIVACAINE HCL (PF) 0.25 % IJ SOLN
INTRAMUSCULAR | Status: AC
  Filled 2024-02-01: qty 30

## 2024-02-01 NOTE — Procedures (Signed)
 Interventional Radiology Procedure Note  Risks and benefits of arthrogram were discussed with the patient including, but not limited to bleeding, infection, injection of contrast outside the joint, and damage to adjacent structures.  All of the patient's questions were answered, patient is agreeable to proceed. Consent signed and in chart.  A timeout was performed with all members of the team prior to start of the procedure. Correct patient and correct procedure was confirmed. Allergies were reviewed.   PROCEDURE SUMMARY:  Successful fluoro guided LEFT shoulder arthrogram and intra-articular steroid injection. No immediate complications.  Pt tolerated well.   EBL = none  Please see full dictation in imaging section of Epic for procedure details.   Electronically Signed: Carlin DELENA Griffon, PA-C 02/01/2024, 11:22 AM

## 2024-02-03 ENCOUNTER — Encounter (HOSPITAL_COMMUNITY): Payer: Self-pay

## 2024-02-03 ENCOUNTER — Emergency Department (HOSPITAL_COMMUNITY)
Admission: EM | Admit: 2024-02-03 | Discharge: 2024-02-03 | Disposition: A | Discharge status: Home / Self Care | Attending: Emergency Medicine | Admitting: Emergency Medicine

## 2024-02-03 DIAGNOSIS — I10 Essential (primary) hypertension: Secondary | ICD-10-CM | POA: Insufficient documentation

## 2024-02-03 DIAGNOSIS — Z7982 Long term (current) use of aspirin: Secondary | ICD-10-CM | POA: Insufficient documentation

## 2024-02-03 DIAGNOSIS — Z96651 Presence of right artificial knee joint: Secondary | ICD-10-CM | POA: Insufficient documentation

## 2024-02-03 DIAGNOSIS — Z79899 Other long term (current) drug therapy: Secondary | ICD-10-CM | POA: Insufficient documentation

## 2024-02-03 DIAGNOSIS — M25431 Effusion, right wrist: Secondary | ICD-10-CM | POA: Diagnosis not present

## 2024-02-03 DIAGNOSIS — M25531 Pain in right wrist: Secondary | ICD-10-CM | POA: Diagnosis present

## 2024-02-03 MED ORDER — ACETAMINOPHEN 500 MG PO TABS
1000.0000 mg | ORAL_TABLET | Freq: Once | ORAL | Status: AC
  Administered 2024-02-03: 1000 mg via ORAL
  Filled 2024-02-03 (×2): qty 2

## 2024-02-03 MED ORDER — OXYCODONE HCL 5 MG PO TABS
5.0000 mg | ORAL_TABLET | Freq: Once | ORAL | Status: AC
  Administered 2024-02-03: 5 mg via ORAL
  Filled 2024-02-03 (×2): qty 1

## 2024-02-03 NOTE — ED Triage Notes (Signed)
 Patient states yesterday he was using his leaf blower and then went bowling, when he came home started having hand pain and swelling. Took 800mg  ibuprofen with no relief. No known injury.

## 2024-02-03 NOTE — Discharge Instructions (Signed)
 For pain control you may take 1000 mg of acetaminophen (Tylenol) every 8 hours and/or 600 mg of Ibuprofen (Motrin, Advil, etc.) every 6-8 hours as needed.  Please limit acetaminophen (Tylenol) to 4000 mg and Ibuprofen (Motrin, Advil, etc.) to 2400 mg for a 24hr period. Please note that other over-the-counter medicine may contain acetaminophen or ibuprofen as a component of their ingredients.

## 2024-02-03 NOTE — ED Notes (Signed)
 Ace bandage placed to R wrist; XL wrist brace too small to fit pts wrist

## 2024-02-03 NOTE — ED Provider Notes (Signed)
 [place] [facility] AT [facility] Provider Note  CSN: [id number] Arrival date & time: 02/03/24 9493  Chief Complaint(s) Hand Pain  HPI Sean Adams is a 73 y.o. male here for 1 day of right wrist pain and swelling.  Patient reports Sean Adams initially felt some discomfort after using a leaf blower yesterday.  That evening, Sean Adams went bowling which worsened the pain.  This evening patient was unable to sleep due to the pain.  Sean Adams denies any fall or trauma.  No cuts or wounds distal to the wrist.  No prior history of gout.  The history is provided by the patient.    Past Medical History Past Medical History:  Diagnosis Date   Allergic rhinitis    Arthritis    in my knees- per patient    Coronary artery disease    GERD (gastroesophageal reflux disease)    Hemorrhoids    Hyperlipidemia    Hypertension    Patient Active Problem List   Diagnosis Date Noted   Impingement syndrome of right shoulder 07/15/2021   Arthritis of right acromioclavicular joint 07/15/2021   Labral tear of shoulder, right, initial encounter 07/15/2021   Nontraumatic incomplete tear of right rotator cuff 07/15/2021   Uncontrolled hypertension 04/25/2018   Hypertensive urgency    Hypercholesterolemia    Total knee replacement status 05/04/2017   Primary osteoarthritis of right knee 01/10/2017   Home Medication(s) Prior to Admission medications   Medication Sig Start Date End Date Taking? Authorizing Provider  amLODipine  (NORVASC ) 10 MG tablet Take 1 tablet (10 mg total) by mouth daily. 04/27/18   Diallo, Irving, MD  aspirin  EC 81 MG EC tablet Take 1 tablet (81 mg total) by mouth daily. 04/27/18   Diallo, Irving, MD  atorvastatin  (LIPITOR) 40 MG tablet Take 40 mg by mouth daily.    [provider]  chlorthalidone  (HYGROTON ) 25 MG tablet Take 25 mg by mouth daily.    [provider]  famotidine  (PEPCID ) 20 MG tablet Take 20 mg by mouth 2 (two) times daily.    [provider]  HYDROcodone -acetaminophen  (NORCO) 5-325 MG tablet Take 1 tablet by mouth 3 (three) times daily as needed. To be taken after surgery 07/22/21   Jule Ronal LITTIE, PA-C  loratadine  (CLARITIN ) 10 MG tablet Take 10 mg by mouth daily as needed for allergies.    [provider]  losartan  (COZAAR ) 100 MG tablet Take 1 tablet (100 mg total) by mouth daily. 04/27/18   Diallo, Irving, MD  nitroGLYCERIN (NITROSTAT) 0.4 MG SL tablet Place 0.4 mg under the tongue every 5 (five) minutes as needed for chest pain.    [provider]  ondansetron  (ZOFRAN ) 4 MG tablet Take 1 tablet (4 mg total) by mouth every 8 (eight) hours as needed for nausea or vomiting. 07/22/21   Jule Ronal LITTIE, PA-C  oxyCODONE  (OXY IR/ROXICODONE ) 5 MG immediate release tablet Take 1-3 tablets (5-15 mg total) by mouth every 4 (four) hours as needed. 05/04/17   Jerri Kay HERO, MD  polyethylene glycol (MIRALAX  / GLYCOLAX ) packet Take 17 g by mouth daily as needed for mild constipation.    [provider]  Allergies Oxycontin  [oxycodone  hcl]  Review of Systems Review of Systems As noted in HPI  Physical Exam Vital Signs  I have reviewed the triage vital signs BP (!) 181/114   Pulse 81   Temp (!) 97.5 F (36.4 C) (Oral)   Resp 16   Ht 6' 2 (1.88 m)   Wt 106.6 kg   SpO2 97%   BMI 30.17 kg/m   Physical Exam Vitals reviewed.  Constitutional:      General: Sean Adams is not in acute distress.    Appearance: Sean Adams is well-developed. Sean Adams is not diaphoretic.  HENT:     Head: Normocephalic and atraumatic.     Right Ear: External ear normal.     Left Ear: External ear normal.     Nose: Nose normal.     Mouth/Throat:     Mouth: Mucous membranes are moist.  Eyes:     General: No scleral icterus.    Conjunctiva/sclera: Conjunctivae normal.  Neck:     Trachea: Phonation  normal.  Cardiovascular:     Rate and Rhythm: Normal rate and regular rhythm.  Pulmonary:     Effort: Pulmonary effort is normal. No respiratory distress.     Breath sounds: No stridor.  Abdominal:     General: There is no distension.  Musculoskeletal:        General: Normal range of motion.     Right wrist: Swelling and tenderness present. No deformity or snuff box tenderness. Normal range of motion. Normal pulse.     Left wrist: Normal pulse.     Cervical back: Normal range of motion.  Neurological:     Mental Status: Sean Adams is alert and oriented to person, place, and time.  Psychiatric:        Behavior: Behavior normal.     ED Results and Treatments Labs (all labs ordered are listed, but only abnormal results are displayed) Labs Reviewed - No data to display                                                                                                                       EKG  EKG Interpretation Date/Time:    Ventricular Rate:    PR Interval:    QRS Duration:    QT Interval:    QTC Calculation:   R Axis:      Text Interpretation:         Radiology No results found.  Medications Ordered in ED Medications  oxyCODONE  (Oxy IR/ROXICODONE ) immediate release tablet 5 mg (5 mg Oral Given 02/03/24 0616)  acetaminophen  (TYLENOL ) tablet 1,000 mg (1,000 mg Oral Given 02/03/24 9383)   Procedures Procedures  (including critical care time) Medical Decision Making / ED Course   Medical Decision Making Risk OTC drugs. Prescription drug management.    Right wrist pain and swelling.  No erythema.  No wounds to the hand or digits.  Low suspicious for infectious etiology including septic arthritis.  Possible first inflammatory arthritis versus effusion from osteoarthritis  versus wrist sprain.  Patient provided with oral pain medicine and wrist brace.  Patient reports that Sean Adams will go to the [place] in the morning to be evaluated by an orthopedic    Final Clinical Impression(s) / ED  Diagnoses Final diagnoses:  Right wrist pain   The patient appears reasonably screened and/or stabilized for discharge and I doubt any other medical condition or other [facility] requiring further screening, evaluation, or treatment in the ED at this time. I have discussed the findings, Dx and Tx plan with the patient/family who expressed understanding and agree(s) with the plan. Discharge instructions discussed at length. The patient/family was given strict return precautions who verbalized understanding of the instructions. No further questions at time of discharge.  Disposition: Discharge  Condition: Good  ED Discharge Orders     None        Follow Up: Delana Corean GRADE, MD [phone] [facility] [place] [address] [phone]  Call  to schedule an appointment for close follow up  [facility] [place] Address: [address] [place], [place] [address] Phone: [phone] Go to  as needed    This chart was dictated using voice recognition software.  Despite best efforts to proofread,  errors can occur which can change the documentation meaning.    Trine Raynell Moder, MD 02/03/24 [phone]
